# Patient Record
Sex: Female | Born: 1950 | Race: White | Hispanic: No | Marital: Married | Smoking: Never smoker
Health system: Southern US, Community
[De-identification: ages and names within clinical notes are randomized; demographics above are authoritative.]

## PROBLEM LIST (undated history)

## (undated) DIAGNOSIS — F419 Anxiety disorder, unspecified: Secondary | ICD-10-CM

## (undated) DIAGNOSIS — Z0000 Encounter for general adult medical examination without abnormal findings: Secondary | ICD-10-CM

## (undated) DIAGNOSIS — N83209 Unspecified ovarian cyst, unspecified side: Secondary | ICD-10-CM

## (undated) DIAGNOSIS — B019 Varicella without complication: Secondary | ICD-10-CM

## (undated) DIAGNOSIS — R030 Elevated blood-pressure reading, without diagnosis of hypertension: Principal | ICD-10-CM

## (undated) DIAGNOSIS — C801 Malignant (primary) neoplasm, unspecified: Secondary | ICD-10-CM

## (undated) DIAGNOSIS — H9209 Otalgia, unspecified ear: Secondary | ICD-10-CM

## (undated) DIAGNOSIS — F329 Major depressive disorder, single episode, unspecified: Secondary | ICD-10-CM

## (undated) DIAGNOSIS — R197 Diarrhea, unspecified: Secondary | ICD-10-CM

## (undated) DIAGNOSIS — I998 Other disorder of circulatory system: Secondary | ICD-10-CM

## (undated) DIAGNOSIS — E785 Hyperlipidemia, unspecified: Secondary | ICD-10-CM

## (undated) DIAGNOSIS — T7840XA Allergy, unspecified, initial encounter: Secondary | ICD-10-CM

## (undated) DIAGNOSIS — M8580 Other specified disorders of bone density and structure, unspecified site: Secondary | ICD-10-CM

## (undated) DIAGNOSIS — K219 Gastro-esophageal reflux disease without esophagitis: Secondary | ICD-10-CM

## (undated) DIAGNOSIS — L309 Dermatitis, unspecified: Secondary | ICD-10-CM

## (undated) DIAGNOSIS — R55 Syncope and collapse: Secondary | ICD-10-CM

## (undated) DIAGNOSIS — R05 Cough: Secondary | ICD-10-CM

## (undated) HISTORY — DX: Encounter for general adult medical examination without abnormal findings: Z00.00

## (undated) HISTORY — DX: Syncope and collapse: R55

## (undated) HISTORY — PX: OTHER SURGICAL HISTORY: SHX169

## (undated) HISTORY — DX: Allergy, unspecified, initial encounter: T78.40XA

## (undated) HISTORY — DX: Malignant (primary) neoplasm, unspecified: C80.1

## (undated) HISTORY — DX: Hyperlipidemia, unspecified: E78.5

## (undated) HISTORY — DX: Elevated blood-pressure reading, without diagnosis of hypertension: R03.0

## (undated) HISTORY — DX: Otalgia, unspecified ear: H92.09

## (undated) HISTORY — DX: Major depressive disorder, single episode, unspecified: F32.9

## (undated) HISTORY — DX: Unspecified ovarian cyst, unspecified side: N83.209

## (undated) HISTORY — DX: Gastro-esophageal reflux disease without esophagitis: K21.9

## (undated) HISTORY — DX: Varicella without complication: B01.9

## (undated) HISTORY — DX: Dermatitis, unspecified: L30.9

## (undated) HISTORY — DX: Cough: R05

## (undated) HISTORY — DX: Other specified disorders of bone density and structure, unspecified site: M85.80

## (undated) HISTORY — DX: Other disorder of circulatory system: I99.8

## (undated) HISTORY — DX: Anxiety disorder, unspecified: F41.9

## (undated) HISTORY — DX: Diarrhea, unspecified: R19.7

---

## 1994-08-03 HISTORY — PX: EYE SURGERY: SHX253

## 2005-03-03 HISTORY — PX: OTHER SURGICAL HISTORY: SHX169

## 2005-03-20 ENCOUNTER — Ambulatory Visit (HOSPITAL_COMMUNITY): Admission: RE | Admit: 2005-03-20 | Discharge: 2005-03-20 | Payer: Self-pay | Admitting: Orthopedic Surgery

## 2005-03-20 ENCOUNTER — Ambulatory Visit (HOSPITAL_BASED_OUTPATIENT_CLINIC_OR_DEPARTMENT_OTHER): Admission: RE | Admit: 2005-03-20 | Discharge: 2005-03-20 | Payer: Self-pay | Admitting: Orthopedic Surgery

## 2010-06-03 DIAGNOSIS — C439 Malignant melanoma of skin, unspecified: Secondary | ICD-10-CM

## 2010-06-03 HISTORY — DX: Malignant melanoma of skin, unspecified: C43.9

## 2010-08-03 HISTORY — PX: OTHER SURGICAL HISTORY: SHX169

## 2011-05-05 ENCOUNTER — Encounter: Payer: Self-pay | Admitting: Family Medicine

## 2011-05-05 ENCOUNTER — Telehealth: Payer: Self-pay | Admitting: Family Medicine

## 2011-05-05 ENCOUNTER — Ambulatory Visit (INDEPENDENT_AMBULATORY_CARE_PROVIDER_SITE_OTHER): Payer: Self-pay | Admitting: Family Medicine

## 2011-05-05 VITALS — BP 150/90 | HR 106 | Temp 98.0°F | Ht 64.25 in | Wt 174.8 lb

## 2011-05-05 DIAGNOSIS — T7840XA Allergy, unspecified, initial encounter: Secondary | ICD-10-CM

## 2011-05-05 DIAGNOSIS — M8580 Other specified disorders of bone density and structure, unspecified site: Secondary | ICD-10-CM

## 2011-05-05 DIAGNOSIS — R059 Cough, unspecified: Secondary | ICD-10-CM

## 2011-05-05 DIAGNOSIS — R05 Cough: Secondary | ICD-10-CM

## 2011-05-05 DIAGNOSIS — M79672 Pain in left foot: Secondary | ICD-10-CM

## 2011-05-05 DIAGNOSIS — Z23 Encounter for immunization: Secondary | ICD-10-CM

## 2011-05-05 DIAGNOSIS — R030 Elevated blood-pressure reading, without diagnosis of hypertension: Secondary | ICD-10-CM

## 2011-05-05 DIAGNOSIS — M2550 Pain in unspecified joint: Secondary | ICD-10-CM

## 2011-05-05 DIAGNOSIS — B019 Varicella without complication: Secondary | ICD-10-CM | POA: Insufficient documentation

## 2011-05-05 DIAGNOSIS — I10 Essential (primary) hypertension: Secondary | ICD-10-CM | POA: Insufficient documentation

## 2011-05-05 DIAGNOSIS — Z79899 Other long term (current) drug therapy: Secondary | ICD-10-CM

## 2011-05-05 DIAGNOSIS — R252 Cramp and spasm: Secondary | ICD-10-CM

## 2011-05-05 DIAGNOSIS — L309 Dermatitis, unspecified: Secondary | ICD-10-CM

## 2011-05-05 DIAGNOSIS — B269 Mumps without complication: Secondary | ICD-10-CM | POA: Insufficient documentation

## 2011-05-05 DIAGNOSIS — B059 Measles without complication: Secondary | ICD-10-CM | POA: Insufficient documentation

## 2011-05-05 DIAGNOSIS — Z0000 Encounter for general adult medical examination without abnormal findings: Secondary | ICD-10-CM

## 2011-05-05 DIAGNOSIS — IMO0001 Reserved for inherently not codable concepts without codable children: Secondary | ICD-10-CM

## 2011-05-05 DIAGNOSIS — H9209 Otalgia, unspecified ear: Secondary | ICD-10-CM

## 2011-05-05 DIAGNOSIS — M94262 Chondromalacia, left knee: Secondary | ICD-10-CM

## 2011-05-05 DIAGNOSIS — C439 Malignant melanoma of skin, unspecified: Secondary | ICD-10-CM

## 2011-05-05 HISTORY — DX: Allergy, unspecified, initial encounter: T78.40XA

## 2011-05-05 HISTORY — DX: Dermatitis, unspecified: L30.9

## 2011-05-05 HISTORY — DX: Otalgia, unspecified ear: H92.09

## 2011-05-05 HISTORY — DX: Reserved for inherently not codable concepts without codable children: IMO0001

## 2011-05-05 HISTORY — DX: Other specified disorders of bone density and structure, unspecified site: M85.80

## 2011-05-05 HISTORY — DX: Cough, unspecified: R05.9

## 2011-05-05 LAB — HEPATIC FUNCTION PANEL
AST: 30 U/L (ref 0–37)
Alkaline Phosphatase: 86 U/L (ref 39–117)
Bilirubin, Direct: 0 mg/dL (ref 0.0–0.3)
Total Bilirubin: 0.7 mg/dL (ref 0.3–1.2)

## 2011-05-05 LAB — MAGNESIUM: Magnesium: 2.2 mg/dL (ref 1.5–2.5)

## 2011-05-05 LAB — CBC
MCV: 93.4 fL (ref 78.0–100.0)
Platelets: 272 10*3/uL (ref 150–400)
RBC: 4.57 MIL/uL (ref 3.87–5.11)
WBC: 5.6 10*3/uL (ref 4.0–10.5)

## 2011-05-05 LAB — RENAL FUNCTION PANEL
CO2: 27 mEq/L (ref 19–32)
Chloride: 105 mEq/L (ref 96–112)
GFR: 111.15 mL/min (ref >60.00)
Potassium: 3.9 mEq/L (ref 3.5–5.1)
Sodium: 141 mEq/L (ref 135–145)

## 2011-05-05 LAB — URIC ACID: Uric Acid, Serum: 4 mg/dL (ref 2.4–7.0)

## 2011-05-05 LAB — LIPID PANEL
HDL: 80.5 mg/dL (ref >39.00)
VLDL: 15.2 mg/dL (ref 0.0–40.0)

## 2011-05-05 MED ORDER — AZITHROMYCIN 250 MG PO TABS: ORAL_TABLET | ORAL | Status: DC

## 2011-05-05 MED ORDER — TETANUS-DIPHTH-ACELL PERTUSSIS 5-2.5-18.5 LF-MCG/0.5 IM SUSP: 0.5000 mL | Freq: Once | INTRAMUSCULAR | Status: DC

## 2011-05-05 NOTE — Telephone Encounter (Signed)
Per md okay to send azithromycin. Patient informed that RX was sent to pharmacy

## 2011-05-05 NOTE — Telephone Encounter (Signed)
Patient in lobby waiting for zpack

## 2011-05-05 NOTE — Patient Instructions (Addendum)
Preventative Care for Adults - Female Studies show that half of deaths in the [place] today result from unhealthy lifestyle practices. This includes ignoring preventive care suggestions. Preventive health guidelines for women include the following key practices:  A routine yearly physical is a good way to check with your primary caregiver about your health and preventive screening. It is a chance to share any concerns and updates on your health, and to receive a thorough all-over exam.   If you smoke cigarettes, find out from your caregiver how to quit. It can literally save your life, no matter how long you have been a tobacco user. If you do not use tobacco, never start.   Maintain a healthy diet and normal weight. Increased weight leads to problems with blood pressure and diabetes. Decrease saturated fat in your diet and increase regular exercise. Eat a variety of foods, including fruit, vegetables, animal or vegetable protein (meat, fish, chicken, and eggs, or beans, lentils, and tofu), and grains, such as rice. Get information about proper diet from your caregiver, if needed.   Aerobic exercise helps maintain good heart health. The CDC and the American College of Sports Medicine recommend 30 minutes of moderate-intensity exercise (a brisk walk that increases your heart rate and breathing) on most days of the week. Ongoing high blood pressure should be treated with medicines, if weight loss and exercise are not effective.   Avoid smoking, drinking too much alcohol (more than two drinks per day), and use of street drugs. Do not share needles with anyone. Ask for professional help if you need support or instructions about stopping the use of alcohol, cigarettes, or drugs.   Maintain normal blood lipids and cholesterol, by minimizing your intake of saturated fat. Eat a well rounded diet, with plenty of fruit and vegetables. The National Institutes of Health encourage women to eat 5-9 servings of  fruit and vegetables each day. Your caregiver can give instructions to help you keep your risk of heart disease or stroke low. High blood pressure causes heart disease and increases risk of stroke. Blood pressure should be checked every 1-2 years, from age 20 onward.   Blood tests for high cholesterol, which causes heart and vessel disease, should begin at age 20 and be repeated every 5 years, if test results are normal. (Repeat tests more often if results are high.)   Diabetes screening involves taking a blood sample to check your blood sugar level, after a fasting period. This is done once every 3 years, after age 45, if test results are normal.   Breast cancer screening is essential to preventive care for women. All women age 20 and older should perform a breast self-exam every month. At age 40 and older, women should have their caregiver complete a breast exam each year. Women at ages 40-50 should have a mammogram (x-ray film) of the breasts each year. Your caregiver can discuss when to start your yearly mammograms.   Cervical cancer screening includes taking a Pap smear (sample of cells examined under a microscope) from the cervix (end of the uterus). It also includes testing for HPV (Human Papilloma Virus, which can cause cervical cancer). Screening and a pelvic exam should begin at age 21, or 3 years after a woman becomes sexually active. Screening should occur every year, with a Pap smear but no HPV testing, up to age 30. After age 30, you should have a Pap smear every 3 years with HPV testing, if no HPV was found previously.     Colon cancer can be detected, and often prevented, long before it is life threatening. Most routine colon cancer screening begins at the age of 50. On a yearly basis, your caregiver may provide easy-to-use take-home tests to check for hidden blood in the stool. Use of a small camera at the end of a tube, to directly examine the colon (sigmoidoscopy or colonoscopy), can  detect the earliest forms of colon cancer and can be life saving. Talk to your caregiver about this at age 50, when routine screening begins. (Screening is repeated every 5 years, unless early forms of pre-cancerous polyps or small growths are found.)   Practice safe sex. Use condoms. Condoms are used for birth control and to reduce the spread of sexually transmitted infections (STIs). Unsafe sex is sexual activity without the use of safeguards, such as condoms and avoidance of high-risk acts, to reduce the chances of getting or spreading STIs. STIs include gonorrhea (the clap), chlamydia, syphilis, trichimonas, herpes, HPV (human papilloma virus) and HIV (human immunodeficiency virus), which causes AIDS. Herpes, HIV, and HPV are viral illnesses that have no cure. They can result in disability, cancer, and death.   HPV causes cancer of the cervix, and other infections that can be transmitted from person to person. There is a vaccine for HPV, and females should get immunized between the ages of 11 and 26. It requires a series of 3 shots.   Osteoporosis is a disease in which the bones lose minerals and strength as we age. This can result in serious bone fractures. Risk of osteoporosis can be identified using a bone density scan. Women ages 65 and over should discuss this with their caregivers, as should women after menopause who have other risk factors. Ask your caregiver whether you should be taking a calcium supplement and Vitamin D, to reduce the rate of osteoporosis.   Menopause can be associated with physical symptoms and risks. Hormone replacement therapy is available to decrease these. You should talk to your caregiver about whether starting or continuing to take hormones is right for you.   Use sunscreen with SPF (skin protection factor) of 15 or more. Apply sunscreen liberally and repeatedly throughout the day. Being outside in the sun, when your shadow is shorter than you are, means you are being  exposed to sun at greater intensity. Lighter skinned people are at a greater risk of skin cancer. Wear sunglasses, to protect your eyes from too much damaging sunlight (which can speed up cataract formation).   Once a month, do a whole body skin exam or review, using a mirror to look at your back. Notify your caregiver of changes in moles, especially if there are changes in shapes, colors, irregular border, a size larger than a pencil eraser, or new moles develop.   Keep carbon monoxide and smoke detectors in your home, and functioning, at all times. Change the batteries every 6 months, or use a model that plugs into the wall.   Stay up to date with your tetanus shots and other required immunizations. A booster for tetanus should be given every 10 years. Be sure to get your flu shot every year, since 5%-20% of the [place] population comes down with the flu. The composition of the flu vaccine changes each year, so being vaccinated once is not enough. Get your shot in the fall, before the flu season peaks. The table below lists important vaccines to get. Other vaccines to consider include for Hepatitis A virus (to prevent a form of   infection of the liver, by a virus acquired from food), Varicella Zoster (a virus that causes shingles), and Meninogoccal (against bacteria which cause a form of meningitis).   Brush your teeth twice a day with fluoride toothpaste, and floss once a day. Good oral hygiene prevents tooth decay and gum disease, which can be painful and can cause other health problems. Visit your dentist for a routine oral and dental check up and preventive care every 6-12 months.   The Body Mass Index or BMI is a way of measuring how much of your body is fat. Having a BMI above 27 increases the risk of heart disease, diabetes, hypertension, stroke and other problems related to obesity. Your caregiver can help determine your BMI, and can develop an exercise and dietary program to help you achieve or  maintain this measurement at a healthy level.   Wear seat belts whenever you are in a vehicle, whether as passenger or driver, and even for short drives of a few minutes.   If you bicycle, wear a helmet at all times.  Preventative Care for Adult Women  Preventative Services Ages 51-39 Ages 54-64 Ages 62 and over  Health risk assessment and lifestyle counseling.     Blood pressure check.** Every 1-2 years Every 1-2 years Every 1-2 years  Total cholesterol check including HDL.** Every 5 years beginning at age 26 Every 5 years beginning at age 20, or more often if risk is high Every 5 years through age 1, then optional  Breast self exam. Monthly in all women ages 30 and older Monthly Monthly  Clinical breast exam.** Every 3 years beginning at age 22 Every year Every year  Mammogram.**  Every year beginning at age 58, optional from age 74-49 (discuss with your caregiver). Every year until age 45, then optional  Pap Smear** and HPV Screening. Every year from ages 76 through 59 Every 3 years from ages 59 through 53, if HPV is negative Optional; talk with your caregiver  Flexible sigmoidoscopy** or colonoscopy.**   Every 5 years beginning at age 38 Every 5 years until age 31; then optional  FOBT (fecal occult blood test) of stool.  Every year beginning at age 65 Every year until 55; then optional  Skin self-exam. Monthly Monthly Monthly  Tetanus-diphtheria (Td) immunization. Every 10 years Every 10 years Every 10 years  Influenza immunization.** Every year Every year Every year  HPV immunization. Once between the ages of 15 and 92     Pneumococcal immunization.** Optional Optional Every 5 years  Hepatitis B immunization.** Series of 3 immunizations  (if not done previously, usually given at 0, 1 to 2, and 4 to 6 months)  Check with your caregiver, if vaccination not previously given Check with your caregiver, if vaccination not previously given  ** Family history and personal history of risk and  conditions may change your caregiver's recommendations.  Document Released: 09/15/2001 Document Re-Released: 10/14/2009 [facility] Patient Information 2011 [place], [place].   Minimize hot liquids and caffeine, get 8 hours of sleep nightly, do not skip meals, eat small frequent meals with lean proteins and complex carbs, Avoid heavy carb loads, add a fatty acid supplement such as a fish oil cap like MegaRed 1 cap daily, will help the knee also

## 2011-05-11 ENCOUNTER — Telehealth: Payer: Self-pay | Admitting: Family Medicine

## 2011-05-11 ENCOUNTER — Encounter: Payer: Self-pay | Admitting: Family Medicine

## 2011-05-11 NOTE — Telephone Encounter (Signed)
Order is in the system, glad she agrees to proceed

## 2011-05-12 ENCOUNTER — Encounter: Payer: Self-pay | Admitting: Gastroenterology

## 2011-05-13 ENCOUNTER — Encounter: Payer: Self-pay | Admitting: Family Medicine

## 2011-05-13 DIAGNOSIS — C439 Malignant melanoma of skin, unspecified: Secondary | ICD-10-CM | POA: Insufficient documentation

## 2011-05-13 DIAGNOSIS — G43909 Migraine, unspecified, not intractable, without status migrainosus: Secondary | ICD-10-CM | POA: Insufficient documentation

## 2011-05-13 DIAGNOSIS — M94262 Chondromalacia, left knee: Secondary | ICD-10-CM | POA: Insufficient documentation

## 2011-05-13 DIAGNOSIS — Z0000 Encounter for general adult medical examination without abnormal findings: Secondary | ICD-10-CM | POA: Insufficient documentation

## 2011-05-13 NOTE — Assessment & Plan Note (Signed)
Wears seat belts, declines flu shot today. Recommend DASH diet

## 2011-05-13 NOTE — Assessment & Plan Note (Signed)
Patient following with dermatology has an appt later this month. Is noting some intermittent erthema, warmth and lesions on face recently none now. Consider Rosacea vs Acne. Use Cetaphil Soap and Witch Hazel Astringent and discuss with dermatology

## 2011-05-13 NOTE — Assessment & Plan Note (Signed)
Increase activity and maintain Calcium/Vitamin D supplement. Does Bone scans intermittently at [facility]

## 2011-05-13 NOTE — Progress Notes (Signed)
Grace Klein [id number] 1951-06-27 05/13/2011      Progress Note New Patient  Subjective  Chief Complaint  Chief Complaint  Patient presents with  . Establish Care    new patient    HPI  This is a 60 year old Caucasian female who is in today for new patient appointment. Generally in good health but does have a few concerns today. One is a mild irritative cough for the last 5 weeks. Occasionally productive of clear sputum. She does believe this is allergy related. No fevers, chills, headache, chest pain, palpitations, shortness of breath, GI or GU complaints. She does have a history of melanoma status post Mohs procedures. She does follow with dermatology. She is complaining of some episodes of red hot bumps on her face they come and go. They're not present now. She follows with ophthalmology and takes Ocuvite for her eyes. She is previously followed with orthopedics or podiatry for some knee and foot pain. Denies a history of documented hypertension.  Past Medical History  Diagnosis Date  . Chicken pox as a child  . Measles as a child  . Mumps as a child  . Elevated BP 05/05/2011  . Allergic state 05/05/2011  . Cough 05/05/2011  . Ear pain 05/05/2011  . Dermatitis 05/05/2011  . Allergy     seasonal  . 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 Osteopenia 05/05/2011  . Migraine 05/13/2011  . Preventative health care 05/13/2011    Past Surgical History  Procedure Date  . Facial sugeries 2012    X 2  . 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 Eye surgery 1996    right, near sighted correction    Family History  Problem Relation Age of Onset  . Macular degeneration Mother   . Hypertension Mother   . Arthritis Mother   . Hyperlipidemia Mother   . Hypertension Father   . Heart disease Father   . Other Father     bypass surgery   . Hyperlipidemia Father   . Depression Sister     on Paxil  . Alcohol abuse Sister   . Diabetes Maternal Grandmother     History    Social History  . Marital Status: Married    Spouse Name: N/A    Number of Children: N/A  . Years of Education: N/A   Occupational History  . Not on file.   Social History Main Topics  . Smoking status: Never Smoker   . Smokeless tobacco: Not on file  . Alcohol Use: Yes     a couple ounces once or twice a week  . Drug Use: No  . Sexually Active: No   Other Topics Concern  . Not on file   Social History Narrative  . No narrative on file    No current outpatient prescriptions on file prior to visit.   No current facility-administered medications on file prior to visit.    Allergies  Allergen Reactions  . Clindamycin/Lincomycin Diarrhea    Review of Systems  Review of Systems  Constitutional: Negative for fever, chills and malaise/fatigue.  HENT: Positive for ear pain. Negative for hearing loss, nosebleeds, congestion, sore throat and ear discharge.   Eyes: Negative for discharge.  Respiratory: Positive for cough. Negative for sputum production, shortness of breath and wheezing.        Cough x 5 weeks occasionally productive of clear phlegm, she believes it is allergic  Cardiovascular: Negative for chest pain, palpitations and  leg swelling.  Gastrointestinal: Negative for heartburn, nausea, vomiting, abdominal pain, diarrhea, constipation and blood in stool.  Genitourinary: Negative for dysuria, urgency, frequency and hematuria.  Musculoskeletal: Positive for joint pain. Negative for myalgias, back pain and falls.       Left foot pain, notes history of stress fracture and previous ongoing care with podiatry  Skin: Negative for rash.  Neurological: Negative for dizziness, tremors, sensory change, focal weakness, loss of consciousness, weakness and headaches.  Endo/Heme/Allergies: Negative for polydipsia. Does not bruise/bleed easily.  Psychiatric/Behavioral: Negative for depression and suicidal ideas. The patient is not nervous/anxious and does not have insomnia.      Objective  BP 150/90  Pulse 106  Temp(Src) 98 F (36.7 C) (Oral)  Ht 5' 4.25" (1.632 m)  Wt 174 lb 12.8 oz (79.289 kg)  BMI 29.77 kg/m2  SpO2 100%  Physical Exam  Physical Exam  Constitutional: She is oriented to person, place, and time and well-developed, well-nourished, and in no distress. No distress.  HENT:  Head: Normocephalic and atraumatic.  Right Ear: External ear normal.  Left Ear: External ear normal.  Nose: Nose normal.  Mouth/Throat: Oropharynx is clear and moist. No oropharyngeal exudate.  Eyes: Conjunctivae are normal. Pupils are equal, round, and reactive to light. Right eye exhibits no discharge. Left eye exhibits no discharge. No scleral icterus.  Neck: Normal range of motion. Neck supple. No thyromegaly present.  Cardiovascular: Normal rate, regular rhythm, normal heart sounds and intact distal pulses.   No murmur heard. Pulmonary/Chest: Effort normal and breath sounds normal. No respiratory distress. She has no wheezes. She has no rales.  Abdominal: Soft. Bowel sounds are normal. She exhibits no distension and no mass. There is no tenderness.  Musculoskeletal: Normal range of motion. She exhibits no edema and no tenderness.  Lymphadenopathy:    She has no cervical adenopathy.  Neurological: She is alert and oriented to person, place, and time. She has normal reflexes. No cranial nerve deficit. Coordination normal.  Skin: Skin is warm and dry. No rash noted. She is not diaphoretic.  Psychiatric: Mood, memory and affect normal.       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 Start Fish oil caps  Melanoma Patient following with dermatology has an appt later this month. Is noting some intermittent erthema, warmth and lesions on face recently none now. Consider Rosacea vs Acne. Use Cetaphil Soap and Witch Hazel Astringent and discuss with dermatology  Elevated BP Improved with  repeat but still elevated, encouraged increased activity, lower sodium, consider DASH diet, recheck bp at next visit.  Osteopenia Increase activity and maintain Calcium/Vitamin D supplement. [facility]  Preventative health care Wears seat belts, declines flu shot today. Recommend DASH diet

## 2011-05-13 NOTE — Assessment & Plan Note (Signed)
Has followed with ortho inpast, encouraged ongoing physical activity. Start Fish oil caps

## 2011-05-13 NOTE — Assessment & Plan Note (Signed)
Improved with repeat but still elevated, encouraged increased activity, lower sodium, consider DASH diet, recheck bp at next visit.

## 2011-05-14 ENCOUNTER — Encounter: Payer: Self-pay | Admitting: Family Medicine

## 2011-05-25 ENCOUNTER — Ambulatory Visit (AMBULATORY_SURGERY_CENTER): Payer: Private Health Insurance - Indemnity | Admitting: *Deleted

## 2011-05-25 VITALS — Ht 64.5 in | Wt 175.0 lb

## 2011-05-25 DIAGNOSIS — Z1211 Encounter for screening for malignant neoplasm of colon: Secondary | ICD-10-CM

## 2011-05-25 MED ORDER — PEG-KCL-NACL-NASULF-NA ASC-C 100 G PO SOLR: 1.0000 | Freq: Once | ORAL | Status: DC

## 2011-06-05 ENCOUNTER — Ambulatory Visit (INDEPENDENT_AMBULATORY_CARE_PROVIDER_SITE_OTHER): Payer: Private Health Insurance - Indemnity | Admitting: Family Medicine

## 2011-06-05 ENCOUNTER — Encounter: Payer: Self-pay | Admitting: Family Medicine

## 2011-06-05 VITALS — BP 132/84 | HR 87 | Temp 97.9°F | Ht 64.25 in | Wt 171.8 lb

## 2011-06-05 DIAGNOSIS — E785 Hyperlipidemia, unspecified: Secondary | ICD-10-CM

## 2011-06-05 DIAGNOSIS — R030 Elevated blood-pressure reading, without diagnosis of hypertension: Secondary | ICD-10-CM

## 2011-06-05 DIAGNOSIS — M899 Disorder of bone, unspecified: Secondary | ICD-10-CM

## 2011-06-05 DIAGNOSIS — T7840XA Allergy, unspecified, initial encounter: Secondary | ICD-10-CM

## 2011-06-05 DIAGNOSIS — IMO0001 Reserved for inherently not codable concepts without codable children: Secondary | ICD-10-CM

## 2011-06-05 DIAGNOSIS — L309 Dermatitis, unspecified: Secondary | ICD-10-CM

## 2011-06-05 DIAGNOSIS — M8580 Other specified disorders of bone density and structure, unspecified site: Secondary | ICD-10-CM

## 2011-06-05 DIAGNOSIS — M949 Disorder of cartilage, unspecified: Secondary | ICD-10-CM

## 2011-06-05 DIAGNOSIS — Z0000 Encounter for general adult medical examination without abnormal findings: Secondary | ICD-10-CM

## 2011-06-05 DIAGNOSIS — L259 Unspecified contact dermatitis, unspecified cause: Secondary | ICD-10-CM

## 2011-06-05 HISTORY — DX: Hyperlipidemia, unspecified: E78.5

## 2011-06-05 NOTE — Assessment & Plan Note (Signed)
Avoid trans fats, minimize saturated fats and simple carbs and add a Megared cap and a fiber supplement daily

## 2011-06-05 NOTE — Assessment & Plan Note (Signed)
No c/o today 

## 2011-06-05 NOTE — Assessment & Plan Note (Signed)
Pap not due til December, recommend next pap at next visit

## 2011-06-05 NOTE — Assessment & Plan Note (Signed)
Good numbers today, encouraged to continue with DASH diet

## 2011-06-05 NOTE — Assessment & Plan Note (Addendum)
Mild rash on forearms after using a generic sunscreen, mildly pruritic, may use Witch Hazel astringent or Sarna lotion prn, Zyrtec and/or Benadryl prn

## 2011-06-05 NOTE — Patient Instructions (Signed)
Elevated BP As your heart beats, it forces blood through your arteries. This force is your blood pressure. If the pressure is too high, it is called hypertension (HTN) or high blood pressure. HTN is dangerous because you may have it and not know it. High blood pressure may mean that your heart has to work harder to pump blood. Your arteries may be narrow or stiff. The extra work puts you at risk for heart disease, stroke, and other problems.  Blood pressure consists of two numbers, a higher number over a lower, 110/72, for example. It is stated as "110 over 72." The ideal is below 120 for the top number (systolic) and under 80 for the bottom (diastolic). Write down your blood pressure today. You should pay close attention to your blood pressure if you have certain conditions such as:  Heart failure.   Prior heart attack.   Diabetes   Chronic kidney disease.   Prior stroke.   Multiple risk factors for heart disease.  To see if you have HTN, your blood pressure should be measured while you are seated with your arm held at the level of the heart. It should be measured at least twice. A one-time elevated blood pressure reading (especially in the Emergency Department) does not mean that you need treatment. There may be conditions in which the blood pressure is different between your right and left arms. It is important to see your caregiver soon for a recheck. Most people have essential hypertension which means that there is not a specific cause. This type of high blood pressure may be lowered by changing lifestyle factors such as:  Stress.   Smoking.   Lack of exercise.   Excessive weight.   Drug/tobacco/alcohol use.   Eating less salt.  Most people do not have symptoms from high blood pressure until it has caused damage to the body. Effective treatment can often prevent, delay or reduce that damage. TREATMENT  When a cause has been identified, treatment for high blood pressure is  directed at the cause. There are a large number of medications to treat HTN. These fall into several categories, and your caregiver will help you select the medicines that are best for you. Medications may have side effects. You should review side effects with your caregiver. If your blood pressure stays high after you have made lifestyle changes or started on medicines,   Your medication(s) may need to be changed.   Other problems may need to be addressed.   Be certain you understand your prescriptions, and know how and when to take your medicine.   Be sure to follow up with your caregiver within the time frame advised (usually within two weeks) to have your blood pressure rechecked and to review your medications.   If you are taking more than one medicine to lower your blood pressure, make sure you know how and at what times they should be taken. Taking two medicines at the same time can result in blood pressure that is too low.  SEEK IMMEDIATE MEDICAL CARE IF:  You develop a severe headache, blurred or changing vision, or confusion.   You have unusual weakness or numbness, or a faint feeling.   You have severe chest or abdominal pain, vomiting, or breathing problems.  MAKE SURE YOU:   Understand these instructions.   Will watch your condition.   Will get help right away if you are not doing well or get worse.  Document Released: 07/20/2005 Document Revised: 04/01/2011 Document  Reviewed: 03/09/2008 [facility] Patient Information 2012 [place], [place].

## 2011-06-05 NOTE — Assessment & Plan Note (Signed)
Patient repeat Bone Densitometry ordered today, continue Calcium, Vitamin D supplements and encouraged some small hand held weight arm curls and walking to maintain bone density

## 2011-06-05 NOTE — Progress Notes (Signed)
Grace Klein [id number] 1951-07-19 06/05/2011      Progress Note-Follow Up  Subjective  Chief Complaint  Chief Complaint  Patient presents with  . Follow-up    1 month follow up    HPI  Patient is a 60 year old Caucasian female in today for followup on a new patient appointment overall she is feeling well. She's recently had dermabrasion to her nose for her melanoma. Tolerating the recovery well although it is itchy. She has her colonoscopy scheduled for next week on the seventh with Dr. Jarold Motto. She offers no acute complaints. She's had no febrile illness, chest pain, palpitations, shortness of breath, GI or GU complaints since her last visit. She reports her last Pap was in December of 2011 was normal. She is requesting a referral for her bone densitometry at [facility]. She is noting papular, pruritic rash on her arms she says has been present since she used a new sunscreen. It is slowly improving.  Past Medical History  Diagnosis Date  . Chicken pox as a child  . Measles as a child  . Mumps as a child  . Elevated BP 05/05/2011  . Allergic state 05/05/2011  . Cough 05/05/2011  . Ear pain 05/05/2011  . Dermatitis 05/05/2011  . Allergy     seasonal  . 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 Osteopenia 05/05/2011  . Migraine 05/13/2011  . Preventative health care 05/13/2011  . Hyperlipidemia 06/05/2011    Past Surgical History  Procedure Date  . Facial sugeries 2012    X 2  . 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 Eye surgery 1996    right, near sighted correction    Family History  Problem Relation Age of Onset  . Macular degeneration Mother   . Hypertension Mother   . Arthritis Mother   . Hyperlipidemia Mother   . Hypertension Father   . Heart disease Father   . Other Father     bypass surgery   . Hyperlipidemia Father   . Depression Sister     on Paxil  . Alcohol abuse Sister   . Diabetes Maternal Grandmother   . Colon cancer Neg Hx   . Esophageal  cancer Neg Hx   . Stomach cancer Neg Hx     History   Social History  . Marital Status: Married    Spouse Name: N/A    Number of Children: N/A  . Years of Education: N/A   Occupational History  . Not on file.   Social History Main Topics  . Smoking status: Never Smoker   . Smokeless tobacco: Not on file  . Alcohol Use: No     a couple ounces once or twice a week  . Drug Use: No  . Sexually Active: No   Other Topics Concern  . Not on file   Social History Narrative  . No narrative on file    Current Outpatient Prescriptions on File Prior to Visit  Medication Sig Dispense Refill  . aspirin 81 MG tablet Take 81 mg by mouth daily.        . calcium carbonate (OS-CAL) 600 MG TABS Take 600 mg by mouth 2 (two) times daily with a meal.        . Cetirizine HCl (ZYRTEC PO) Take 1 tablet by mouth daily.        . DiphenhydrAMINE HCl (BENADRYL PO) Take 1 tablet by mouth as needed.        Marland Kitchen  Magnesium 250 MG TABS Take 1 tablet by mouth daily.        . multivitamin-lutein (OCUVITE-LUTEIN) CAPS Take 1 capsule by mouth daily.        . OMEGA-3 KRILL OIL PO Take 90 mg by mouth daily.        . peg 3350 powder (MOVIPREP) 100 G SOLR Take 1 kit (100 g total) by mouth once.  1 kit  0  . vitamin B-12 (CYANOCOBALAMIN) 500 MCG tablet Take 500 mcg by mouth daily.          Allergies  Allergen Reactions  . Clindamycin/Lincomycin Diarrhea    Review of Systems  Review of Systems  Constitutional: Negative for fever and malaise/fatigue.  HENT: Negative for congestion.   Eyes: Negative for discharge.  Respiratory: Negative for shortness of breath.   Cardiovascular: Negative for chest pain, palpitations and leg swelling.  Gastrointestinal: Negative for nausea, abdominal pain and diarrhea.  Genitourinary: Negative for dysuria.  Musculoskeletal: Negative for falls.  Skin: Positive for itching and rash.       Forearms, improving papular rash developed after application of new generic sun  screen  Has a bandage over her nose secondary to recent Dermabrasion for melanoma, following with Dermatology  Neurological: Negative for loss of consciousness and headaches.  Endo/Heme/Allergies: Negative for polydipsia.  Psychiatric/Behavioral: Negative for depression and suicidal ideas. The patient is not nervous/anxious and does not have insomnia.     Objective  BP 132/84  Pulse 87  Temp(Src) 97.9 F (36.6 C) (Oral)  Ht 5' 4.25" (1.632 m)  Wt 171 lb 12.8 oz (77.928 kg)  BMI 29.26 kg/m2  SpO2 100%  Physical Exam  Physical Exam  Constitutional: She is oriented to person, place, and time and well-developed, well-nourished, and in no distress. No distress.  HENT:  Head: Normocephalic and atraumatic.  Eyes: Conjunctivae are normal.  Neck: Neck supple. No thyromegaly present.  Cardiovascular: Normal rate, regular rhythm and normal heart sounds.   No murmur heard. Pulmonary/Chest: Effort normal and breath sounds normal. She has no wheezes.  Abdominal: She exhibits no distension and no mass.  Musculoskeletal: She exhibits no edema.  Lymphadenopathy:    She has no cervical adenopathy.  Neurological: She is alert and oriented to person, place, and time.  Skin: Skin is warm and dry. Rash noted. She is not diaphoretic.       Papular lesions small and erythematous, on forearms. Bandage over nose.  Psychiatric: Memory, affect and judgment normal.    Lab Results  Component Value Date   TSH 0.37 05/05/2011   Lab Results  Component Value Date   WBC 5.6 05/05/2011   HGB 13.8 05/05/2011   HCT 42.7 05/05/2011   MCV 93.4 05/05/2011   PLT 272 05/05/2011   Lab Results  Component Value Date   CREATININE 0.6 05/05/2011   BUN 13 05/05/2011   NA 141 05/05/2011   K 3.9 05/05/2011   CL 105 05/05/2011   CO2 27 05/05/2011   Lab Results  Component Value Date   ALT 22 05/05/2011   AST 30 05/05/2011   ALKPHOS 86 05/05/2011   BILITOT 0.7 05/05/2011   Lab Results  Component Value Date   CHOL  212* 05/05/2011   Lab Results  Component Value Date   HDL 80.50 05/05/2011   No results found for this basename: [facility]   Lab Results  Component Value Date   TRIG 76.0 05/05/2011   Lab Results  Component Value Date   CHOLHDL 3  05/05/2011     Assessment & Plan  Dermatitis Mild rash on forearms after using a generic sunscreen, mildly pruritic, may use Witch Hazel astringent or Sarna lotion prn, Zyrtec and/or Benadryl prn  Osteopenia Patient repeat Bone Densitometry ordered today, continue Calcium, Vitamin D supplements and encouraged some small hand held weight arm curls and walking to maintain bone density  Elevated BP Good numbers today, encouraged to continue with DASH diet  Allergic state No c/o today  Preventative health care Pap not due til December, recommend next pap at next visit  Hyperlipidemia Avoid trans fats, minimize saturated fats and simple carbs and add a Megared cap and a fiber supplement daily

## 2011-06-10 ENCOUNTER — Ambulatory Visit (AMBULATORY_SURGERY_CENTER): Payer: Private Health Insurance - Indemnity | Admitting: Gastroenterology

## 2011-06-10 ENCOUNTER — Encounter: Payer: Self-pay | Admitting: Gastroenterology

## 2011-06-10 VITALS — BP 152/95 | HR 98 | Temp 97.4°F | Resp 17 | Ht 65.3 in | Wt 175.0 lb

## 2011-06-10 DIAGNOSIS — Z1211 Encounter for screening for malignant neoplasm of colon: Secondary | ICD-10-CM

## 2011-06-10 MED ORDER — SODIUM CHLORIDE 0.9 % IV SOLN: 500.0000 mL | INTRAVENOUS | Status: DC

## 2011-06-10 NOTE — Progress Notes (Signed)
Pressure applied to abdomen to cecum.

## 2011-06-10 NOTE — Patient Instructions (Signed)
Please review discharge instructions (blue and green sheets)  Resume regular medications  Please review information about diverticulosis and high fiber diets  Recall colonoscopy in 10 years

## 2011-06-11 ENCOUNTER — Telehealth: Payer: Self-pay | Admitting: *Deleted

## 2011-06-11 NOTE — Telephone Encounter (Signed)

## 2011-06-15 ENCOUNTER — Other Ambulatory Visit: Payer: Private Health Insurance - Indemnity | Admitting: Gastroenterology

## 2011-07-27 ENCOUNTER — Encounter: Payer: Self-pay | Admitting: Family Medicine

## 2011-12-03 ENCOUNTER — Other Ambulatory Visit (INDEPENDENT_AMBULATORY_CARE_PROVIDER_SITE_OTHER): Payer: Private Health Insurance - Indemnity

## 2011-12-03 DIAGNOSIS — E785 Hyperlipidemia, unspecified: Secondary | ICD-10-CM

## 2011-12-03 DIAGNOSIS — M8580 Other specified disorders of bone density and structure, unspecified site: Secondary | ICD-10-CM

## 2011-12-03 DIAGNOSIS — R030 Elevated blood-pressure reading, without diagnosis of hypertension: Secondary | ICD-10-CM

## 2011-12-03 LAB — HEPATIC FUNCTION PANEL
ALT: 16 U/L (ref 0–35)
AST: 25 U/L (ref 0–37)
Alkaline Phosphatase: 81 U/L (ref 39–117)
Bilirubin, Direct: 0 mg/dL (ref 0.0–0.3)
Total Bilirubin: 0.7 mg/dL (ref 0.3–1.2)
Total Protein: 7.7 g/dL (ref 6.0–8.3)

## 2011-12-03 LAB — RENAL FUNCTION PANEL
Albumin: 4.5 g/dL (ref 3.5–5.2)
Calcium: 9.7 mg/dL (ref 8.4–10.5)
Creatinine, Ser: 0.7 mg/dL (ref 0.4–1.2)
Glucose, Bld: 74 mg/dL (ref 70–99)
Phosphorus: 3 mg/dL (ref 2.3–4.6)
Potassium: 3.8 mEq/L (ref 3.5–5.1)

## 2011-12-03 LAB — CBC
HCT: 41.5 % (ref 36.0–46.0)
Hemoglobin: 13.9 g/dL (ref 12.0–15.0)
MCHC: 33.3 g/dL (ref 30.0–36.0)
MCV: 94.5 fl (ref 78.0–100.0)
RDW: 15.1 % — ABNORMAL HIGH (ref 11.5–14.6)
WBC: 5.6 10*3/uL (ref 4.5–10.5)

## 2011-12-03 LAB — LIPID PANEL: Total CHOL/HDL Ratio: 3

## 2011-12-06 LAB — VITAMIN D 1,25 DIHYDROXY: Vitamin D 1, 25 (OH)2 Total: 69 pg/mL (ref 18–72)

## 2011-12-08 ENCOUNTER — Ambulatory Visit (INDEPENDENT_AMBULATORY_CARE_PROVIDER_SITE_OTHER): Payer: Private Health Insurance - Indemnity | Admitting: Family Medicine

## 2011-12-08 ENCOUNTER — Encounter: Payer: Self-pay | Admitting: Family Medicine

## 2011-12-08 VITALS — BP 134/78 | HR 98 | Temp 100.0°F | Ht 64.25 in | Wt 164.8 lb

## 2011-12-08 DIAGNOSIS — G43909 Migraine, unspecified, not intractable, without status migrainosus: Secondary | ICD-10-CM

## 2011-12-08 DIAGNOSIS — Z0000 Encounter for general adult medical examination without abnormal findings: Secondary | ICD-10-CM

## 2011-12-08 DIAGNOSIS — K219 Gastro-esophageal reflux disease without esophagitis: Secondary | ICD-10-CM

## 2011-12-08 DIAGNOSIS — L905 Scar conditions and fibrosis of skin: Secondary | ICD-10-CM | POA: Insufficient documentation

## 2011-12-08 DIAGNOSIS — N83209 Unspecified ovarian cyst, unspecified side: Secondary | ICD-10-CM

## 2011-12-08 DIAGNOSIS — T7840XA Allergy, unspecified, initial encounter: Secondary | ICD-10-CM

## 2011-12-08 DIAGNOSIS — E785 Hyperlipidemia, unspecified: Secondary | ICD-10-CM

## 2011-12-08 DIAGNOSIS — IMO0001 Reserved for inherently not codable concepts without codable children: Secondary | ICD-10-CM

## 2011-12-08 HISTORY — DX: Unspecified ovarian cyst, unspecified side: N83.209

## 2011-12-08 HISTORY — DX: Reserved for inherently not codable concepts without codable children: IMO0001

## 2011-12-08 NOTE — Assessment & Plan Note (Signed)
Using scar away patch and is shrinking. Did have a steroid injection as well.

## 2011-12-08 NOTE — Assessment & Plan Note (Signed)
Is considering complete hysterectomy because they do not have a tissue diagnosis of the lesion. Will reconsider after July [place]

## 2011-12-08 NOTE — Progress Notes (Signed)
Patient ID: DEBROAH SHUTTLEWORTH, female   DOB: [birth year], 61 y.o.   MRN: [id number] DIONNE KNOOP [id number] 06/27/1951 12/08/2011      Progress Note New Patient  Subjective  Chief Complaint  Chief Complaint  Patient presents with  . Annual Exam    physical    HPI  Patient is a 61 year old Caucasian female who is in today for annual exam. Overall she is doing relatively well. She continues to follow with OB/GYN for her Paps. Does her mammograms with [facility]. Denies any complaints in that regard. Has been trying to lose weight and has been increasing more and eating better. She has had some issues abdominal pain was found to have a left ovarian cysts. She denies any reflux, chest pain, fevers, chills, GI complaints otherwise. Does not show significantly with allergies this year. No ear pain or headaches.  Past Medical History  Diagnosis Date  . Chicken pox as a child  . Measles as a child  . Mumps as a child  . Elevated BP 05/05/2011  . Allergic state 05/05/2011  . Cough 05/05/2011  . Ear pain 05/05/2011  . Dermatitis 05/05/2011  . Allergy     seasonal  . 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 Osteopenia 05/05/2011  . Migraine 05/13/2011  . Preventative health care 05/13/2011  . Hyperlipidemia 06/05/2011  . Reflux 12/08/2011  . Ovarian cyst 12/08/2011    Left Pain on right intermittently Dr Laural Benes of GYN is following this and next [place] is scheduled for July 2013.    Past Surgical History  Procedure Date  . Facial sugeries 2012    X 2  . 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 Eye surgery 1996    right, near sighted correction    Family History  Problem Relation Age of Onset  . Macular degeneration Mother   . Hypertension Mother   . Arthritis Mother   . Hyperlipidemia Mother   . Hypertension Father   . Heart disease Father   . Other Father     bypass surgery   . Hyperlipidemia Father   . Depression Sister     on Paxil  . Alcohol abuse Sister   . Diabetes Maternal  Grandmother   . Colon cancer Neg Hx   . Esophageal cancer Neg Hx   . Stomach cancer Neg Hx     History   Social History  . Marital Status: Married    Spouse Name: N/A    Number of Children: N/A  . Years of Education: N/A   Occupational History  . Not on file.   Social History Main Topics  . Smoking status: Never Smoker   . Smokeless tobacco: Never Used  . Alcohol Use: No     a couple ounces once or twice a week  . Drug Use: No  . Sexually Active: No   Other Topics Concern  . Not on file   Social History Narrative  . No narrative on file    Current Outpatient Prescriptions on File Prior to Visit  Medication Sig Dispense Refill  . calcium carbonate (OS-CAL) 600 MG TABS Take 600 mg by mouth 2 (two) times daily with a meal.        . multivitamin-lutein (OCUVITE-LUTEIN) CAPS Take 1 capsule by mouth daily.        . OMEGA-3 KRILL OIL PO Take 90 mg by mouth daily.          Allergies  Allergen Reactions  .  Clindamycin/Lincomycin Diarrhea    Review of Systems  Review of Systems  Constitutional: Negative for fever, chills and malaise/fatigue.  HENT: Negative for hearing loss, nosebleeds and congestion.   Eyes: Negative for discharge.  Respiratory: Negative for cough, sputum production, shortness of breath and wheezing.   Cardiovascular: Negative for chest pain, palpitations and leg swelling.  Gastrointestinal: Positive for heartburn. Negative for nausea, vomiting, abdominal pain, diarrhea, constipation and blood in stool.  Genitourinary: Negative for dysuria, urgency, frequency and hematuria.  Musculoskeletal: Negative for myalgias, back pain and falls.  Skin: Negative for rash.  Neurological: Negative for dizziness, tremors, sensory change, focal weakness, loss of consciousness, weakness and headaches.  Endo/Heme/Allergies: Negative for polydipsia. Does not bruise/bleed easily.  Psychiatric/Behavioral: Negative for depression and suicidal ideas. The patient is not  nervous/anxious and does not have insomnia.     Objective  BP 137/86  Pulse 98  Temp(Src) 100 F (37.8 C) (Temporal)  Ht 5' 4.25" (1.632 m)  Wt 164 lb 12.8 oz (74.753 kg)  BMI 28.07 kg/m2  SpO2 100%  Physical Exam  Physical Exam  Constitutional: She is oriented to person, place, and time and well-developed, well-nourished, and in no distress. No distress.  HENT:  Head: Normocephalic and atraumatic.  Right Ear: External ear normal.  Left Ear: External ear normal.  Nose: Nose normal.  Mouth/Throat: Oropharynx is clear and moist. No oropharyngeal exudate.  Eyes: Conjunctivae are normal. Pupils are equal, round, and reactive to light. Right eye exhibits no discharge. Left eye exhibits no discharge. No scleral icterus.  Neck: Normal range of motion. Neck supple. No thyromegaly present.  Cardiovascular: Normal rate, regular rhythm, normal heart sounds and intact distal pulses.   No murmur heard. Pulmonary/Chest: Effort normal and breath sounds normal. No respiratory distress. She has no wheezes. She has no rales.  Abdominal: Soft. Bowel sounds are normal. She exhibits no distension and no mass. There is no tenderness.  Musculoskeletal: Normal range of motion. She exhibits no edema and no tenderness.  Lymphadenopathy:    She has no cervical adenopathy.  Neurological: She is alert and oriented to person, place, and time. She has normal reflexes. No cranial nerve deficit. Coordination normal.  Skin: Skin is warm and dry. No rash noted. She is not diaphoretic.  Psychiatric: Mood, memory and affect normal.       Assessment & Plan  Hyperlipidemia Avoid trans fats, continue Krill oil, add Benefiber. Increase exercise  Allergic state Too much sedation with all OTC antihistamines so just tolerates. Consider nasal saline prn.  Migraine No recent trouble. Has mild Has but not migrainous. Usually responds to Tylenol as needed. Uses Ibuprofen if severe  Preventative health care Up  to date on immunizations. Follows with [facility] for [employer]. No concerns.  Ovarian cyst Is considering complete hysterectomy because they do not have a tissue diagnosis of the lesion. Will reconsider after July [place]  Scar of skin Using scar away patch and is shrinking. Did have a steroid injection as well.  Reflux Infrequent symptoms not needing any meds

## 2011-12-08 NOTE — Assessment & Plan Note (Signed)
Up to date on immunizations. Follows with [facility] for [employer]. No concerns.

## 2011-12-08 NOTE — Assessment & Plan Note (Signed)
Avoid trans fats, continue Krill oil, add Benefiber. Increase exercise

## 2011-12-08 NOTE — Assessment & Plan Note (Signed)
Too much sedation with all OTC antihistamines so just tolerates. Consider nasal saline prn.

## 2011-12-08 NOTE — Assessment & Plan Note (Signed)
No recent trouble. Has mild Has but not migrainous. Usually responds to Tylenol as needed. Uses Ibuprofen if severe

## 2011-12-08 NOTE — Assessment & Plan Note (Signed)
Infrequent symptoms not needing any meds

## 2011-12-08 NOTE — Patient Instructions (Signed)
Hypertriglyceridemia  Diet for High blood levels of Triglycerides Most fats in food are triglycerides. Triglycerides in your blood are stored as fat in your body. High levels of triglycerides in your blood may put you at a greater risk for heart disease and stroke.  Normal triglyceride levels are less than 150 mg/dL. Borderline high levels are 150-199 mg/dl. High levels are 200 - 499 mg/dL, and very high triglyceride levels are greater than 500 mg/dL. The decision to treat high triglycerides is generally based on the level. For people with borderline or high triglyceride levels, treatment includes weight loss and exercise. Drugs are recommended for people with very high triglyceride levels. Many people who need treatment for high triglyceride levels have metabolic syndrome. This syndrome is a collection of disorders that often include: insulin resistance, high blood pressure, blood clotting problems, high cholesterol and triglycerides. TESTING PROCEDURE FOR TRIGLYCERIDES  You should not eat 4 hours before getting your triglycerides measured. The normal range of triglycerides is between 10 and 250 milligrams per deciliter (mg/dl). Some people may have extreme levels (1000 or above), but your triglyceride level may be too high if it is above 150 mg/dl, depending on what other risk factors you have for heart disease.   People with high blood triglycerides may also have high blood cholesterol levels. If you have high blood cholesterol as well as high blood triglycerides, your risk for heart disease is probably greater than if you only had high triglycerides. High blood cholesterol is one of the main risk factors for heart disease.  CHANGING YOUR DIET  Your weight can affect your blood triglyceride level. If you are more than 20% above your ideal body weight, you may be able to lower your blood triglycerides by losing weight. Eating less and exercising regularly is the best way to combat this. Fat provides  more calories than any other food. The best way to lose weight is to eat less fat. Only 30% of your total calories should come from fat. Less than 7% of your diet should come from saturated fat. A diet low in fat and saturated fat is the same as a diet to decrease blood cholesterol. By eating a diet lower in fat, you may lose weight, lower your blood cholesterol, and lower your blood triglyceride level.  Eating a diet low in fat, especially saturated fat, may also help you lower your blood triglyceride level. Ask your dietitian to help you figure how much fat you can eat based on the number of calories your caregiver has prescribed for you.  Exercise, in addition to helping with weight loss may also help lower triglyceride levels.   Alcohol can increase blood triglycerides. You may need to stop drinking alcoholic beverages.   Too much carbohydrate in your diet may also increase your blood triglycerides. Some complex carbohydrates are necessary in your diet. These may include bread, rice, potatoes, other starchy vegetables and cereals.   Reduce "simple" carbohydrates. These may include pure sugars, candy, honey, and jelly without losing other nutrients. If you have the kind of high blood triglycerides that is affected by the amount of carbohydrates in your diet, you will need to eat less sugar and less high-sugar foods. Your caregiver can help you with this.   Adding 2-4 grams of fish oil (EPA+ DHA) may also help lower triglycerides. Speak with your caregiver before adding any supplements to your regimen.  Following the Diet  Maintain your ideal weight. Your caregivers can help you with a diet. Generally,   eating less food and getting more exercise will help you lose weight. Joining a weight control group may also help. Ask your caregivers for a good weight control group in your area.  Eat low-fat foods instead of high-fat foods. This can help you lose weight too.  These foods are lower in fat. Eat MORE  of these:   Dried beans, peas, and lentils.   Egg whites.   Low-fat cottage cheese.   Fish.   Lean cuts of meat, such as round, sirloin, rump, and flank (cut extra fat off meat you fix).   Whole grain breads, cereals and pasta.   Skim and nonfat dry milk.   Low-fat yogurt.   Poultry without the skin.   Cheese made with skim or part-skim milk, such as mozzarella, parmesan, farmers', ricotta, or pot cheese.  These are higher fat foods. Eat LESS of these:   Whole milk and foods made from whole milk, such as American, blue, cheddar, monterey jack, and swiss cheese   High-fat meats, such as luncheon meats, sausages, knockwurst, bratwurst, hot dogs, ribs, corned beef, ground pork, and regular ground beef.   Fried foods.  Limit saturated fats in your diet. Substituting unsaturated fat for saturated fat may decrease your blood triglyceride level. You will need to read package labels to know which products contain saturated fats.  These foods are high in saturated fat. Eat LESS of these:   Fried pork skins.   Whole milk.   Skin and fat from poultry.   Palm oil.   Butter.   Shortening.   Cream cheese.   Bacon.   Margarines and baked goods made from listed oils.   Vegetable shortenings.   Chitterlings.   Fat from meats.   Coconut oil.   Palm kernel oil.   Lard.   Cream.   Sour cream.   Fatback.   Coffee whiteners and non-dairy creamers made with these oils.   Cheese made from whole milk.  Use unsaturated fats (both polyunsaturated and monounsaturated) moderately. Remember, even though unsaturated fats are better than saturated fats; you still want a diet low in total fat.  These foods are high in unsaturated fat:   Canola oil.   Sunflower oil.   Mayonnaise.   Almonds.   Peanuts.   Pine nuts.   Margarines made with these oils.   Safflower oil.   Olive oil.   Avocados.   Cashews.   Peanut butter.   Sunflower seeds.   Soybean oil.     Peanut oil.   Olives.   Pecans.   Walnuts.   Pumpkin seeds.  Avoid sugar and other high-sugar foods. This will decrease carbohydrates without decreasing other nutrients. Sugar in your food goes rapidly to your blood. When there is excess sugar in your blood, your liver may use it to make more triglycerides. Sugar also contains calories without other important nutrients.  Eat LESS of these:   Sugar, brown sugar, powdered sugar, jam, jelly, preserves, honey, syrup, molasses, pies, candy, cakes, cookies, frosting, pastries, colas, soft drinks, punches, fruit drinks, and regular gelatin.   Avoid alcohol. Alcohol, even more than sugar, may increase blood triglycerides. In addition, alcohol is high in calories and low in nutrients. Ask for sparkling water, or a diet soft drink instead of an alcoholic beverage.  Suggestions for planning and preparing meals   Bake, broil, grill or roast meats instead of frying.   Remove fat from meats and skin from poultry before cooking.   Add spices,   herbs, lemon juice or vinegar to vegetables instead of salt, rich sauces or gravies.   Use a non-stick skillet without fat or use no-stick sprays.   Cool and refrigerate stews and broth. Then remove the hardened fat floating on the surface before serving.   Refrigerate meat drippings and skim off fat to make low-fat gravies.   Serve more fish.   Use less butter, margarine and other high-fat spreads on bread or vegetables.   Use skim or reconstituted non-fat dry milk for cooking.   Cook with low-fat cheeses.   Substitute low-fat yogurt or cottage cheese for all or part of the sour cream in recipes for sauces, dips or congealed salads.   Use half yogurt/half mayonnaise in salad recipes.   Substitute evaporated skim milk for cream. Evaporated skim milk or reconstituted non-fat dry milk can be whipped and substituted for whipped cream in certain recipes.   Choose fresh fruits for dessert instead of  high-fat foods such as pies or cakes. Fruits are naturally low in fat.  When Dining Out   Order low-fat appetizers such as fruit or vegetable juice, pasta with vegetables or tomato sauce.   Select clear, rather than cream soups.   Ask that dressings and gravies be served on the side. Then use less of them.   Order foods that are baked, broiled, poached, steamed, stir-fried, or roasted.   Ask for margarine instead of butter, and use only a small amount.   Drink sparkling water, unsweetened tea or coffee, or diet soft drinks instead of alcohol or other sweet beverages.  QUESTIONS AND ANSWERS ABOUT OTHER FATS IN THE BLOOD: SATURATED FAT, TRANS FAT, AND CHOLESTEROL What is trans fat? Trans fat is a type of fat that is formed when vegetable oil is hardened through a process called hydrogenation. This process helps makes foods more solid, gives them shape, and prolongs their shelf life. Trans fats are also called hydrogenated or partially hydrogenated oils.  What do saturated fat, trans fat, and cholesterol in foods have to do with heart disease? Saturated fat, trans fat, and cholesterol in the diet all raise the level of LDL "bad" cholesterol in the blood. The higher the LDL cholesterol, the greater the risk for coronary heart disease (CHD). Saturated fat and trans fat raise LDL similarly.  What foods contain saturated fat, trans fat, and cholesterol? High amounts of saturated fat are found in animal products, such as fatty cuts of meat, chicken skin, and full-fat dairy products like butter, whole milk, cream, and cheese, and in tropical vegetable oils such as palm, palm kernel, and coconut oil. Trans fat is found in some of the same foods as saturated fat, such as vegetable shortening, some margarines (especially hard or stick margarine), crackers, cookies, baked goods, fried foods, salad dressings, and other processed foods made with partially hydrogenated vegetable oils. Small amounts of trans fat  also occur naturally in some animal products, such as milk products, beef, and lamb. Foods high in cholesterol include liver, other organ meats, egg yolks, shrimp, and full-fat dairy products. How can I use the new food label to make heart-healthy food choices? Check the Nutrition Facts panel of the food label. Choose foods lower in saturated fat, trans fat, and cholesterol. For saturated fat and cholesterol, you can also use the Percent Daily Value (%DV): 5% DV or less is low, and 20% DV or more is high. (There is no %DV for trans fat.) Use the Nutrition Facts panel to choose foods low in   saturated fat and cholesterol, and if the trans fat is not listed, read the ingredients and limit products that list shortening or hydrogenated or partially hydrogenated vegetable oil, which tend to be high in trans fat. POINTS TO REMEMBER: YOU NEED A LITTLE TLC (THERAPEUTIC LIFESTYLE CHANGES)  Discuss your risk for heart disease with your caregivers, and take steps to reduce risk factors.   Change your diet. Choose foods that are low in saturated fat, trans fat, and cholesterol.   Add exercise to your daily routine if it is not already being done. Participate in physical activity of moderate intensity, like brisk walking, for at least 30 minutes on most, and preferably all days of the week. No time? Break the 30 minutes into three, 10-minute segments during the day.   Stop smoking. If you do smoke, contact your caregiver to discuss ways in which they can help you quit.   Do not use street drugs.   Maintain a normal weight.   Maintain a healthy blood pressure.   Keep up with your blood work for checking the fats in your blood as directed by your caregiver.  Document Released: 05/07/2004 Document Revised: 07/09/2011 Document Reviewed: 12/03/2008 [employer] Patient Information 2012 [employer]. 

## 2012-05-06 ENCOUNTER — Other Ambulatory Visit: Payer: Private Health Insurance - Indemnity

## 2012-05-06 LAB — CBC
HCT: 43.5 % (ref 36.0–46.0)
RBC: 4.45 Mil/uL (ref 3.87–5.11)
RDW: 13.8 % (ref 11.5–14.6)
WBC: 6.7 10*3/uL (ref 4.5–10.5)

## 2012-05-06 LAB — RENAL FUNCTION PANEL
Chloride: 103 mEq/L (ref 96–112)
GFR: 92.45 mL/min (ref >60.00)
Glucose, Bld: 84 mg/dL (ref 70–99)
Phosphorus: 3 mg/dL (ref 2.3–4.6)
Potassium: 4.1 mEq/L (ref 3.5–5.1)
Sodium: 139 mEq/L (ref 135–145)

## 2012-05-06 LAB — HEPATIC FUNCTION PANEL
ALT: 17 U/L (ref 0–35)
Albumin: 4.4 g/dL (ref 3.5–5.2)
Total Protein: 7.9 g/dL (ref 6.0–8.3)

## 2012-05-06 LAB — LIPID PANEL
HDL: 66.7 mg/dL (ref >39.00)
Total CHOL/HDL Ratio: 3
VLDL: 20.8 mg/dL (ref 0.0–40.0)

## 2012-05-06 LAB — LDL CHOLESTEROL, DIRECT: Direct LDL: 136.3 mg/dL

## 2012-05-13 ENCOUNTER — Encounter: Payer: Self-pay | Admitting: Family Medicine

## 2012-05-13 ENCOUNTER — Ambulatory Visit (INDEPENDENT_AMBULATORY_CARE_PROVIDER_SITE_OTHER): Payer: Private Health Insurance - Indemnity | Admitting: Family Medicine

## 2012-05-13 VITALS — BP 148/88 | HR 95 | Temp 99.2°F | Ht 64.25 in | Wt 164.8 lb

## 2012-05-13 DIAGNOSIS — IMO0001 Reserved for inherently not codable concepts without codable children: Secondary | ICD-10-CM

## 2012-05-13 DIAGNOSIS — F419 Anxiety disorder, unspecified: Secondary | ICD-10-CM

## 2012-05-13 DIAGNOSIS — R030 Elevated blood-pressure reading, without diagnosis of hypertension: Secondary | ICD-10-CM

## 2012-05-13 DIAGNOSIS — C439 Malignant melanoma of skin, unspecified: Secondary | ICD-10-CM

## 2012-05-13 DIAGNOSIS — T7840XA Allergy, unspecified, initial encounter: Secondary | ICD-10-CM

## 2012-05-13 DIAGNOSIS — K219 Gastro-esophageal reflux disease without esophagitis: Secondary | ICD-10-CM

## 2012-05-13 DIAGNOSIS — Z0000 Encounter for general adult medical examination without abnormal findings: Secondary | ICD-10-CM

## 2012-05-13 DIAGNOSIS — F329 Major depressive disorder, single episode, unspecified: Secondary | ICD-10-CM

## 2012-05-13 DIAGNOSIS — E785 Hyperlipidemia, unspecified: Secondary | ICD-10-CM

## 2012-05-13 DIAGNOSIS — R197 Diarrhea, unspecified: Secondary | ICD-10-CM

## 2012-05-13 DIAGNOSIS — F341 Dysthymic disorder: Secondary | ICD-10-CM

## 2012-05-13 DIAGNOSIS — Z23 Encounter for immunization: Secondary | ICD-10-CM

## 2012-05-13 MED ORDER — LORAZEPAM 0.5 MG PO TABS: 0.5000 mg | ORAL_TABLET | Freq: Two times a day (BID) | ORAL | Status: DC | PRN

## 2012-05-13 MED ORDER — CITALOPRAM HYDROBROMIDE 20 MG PO TABS: 20.0000 mg | ORAL_TABLET | Freq: Every day | ORAL | Status: DC

## 2012-05-13 NOTE — Patient Instructions (Addendum)

## 2012-05-13 NOTE — Assessment & Plan Note (Addendum)
Uses Allegra prn,  May continue and may use a second dose daily as needed on occasion, try Nasal saline prn

## 2012-05-13 NOTE — Assessment & Plan Note (Signed)
Up slightly, discussed need for avoiding trans fats, continue krill oil, continue activity

## 2012-05-13 NOTE — Assessment & Plan Note (Addendum)
Improved with recheck, patient under a great deal of stress, will treat her anxiety and reassess at next visit, if still elevated may need to initiate treatment

## 2012-05-13 NOTE — Assessment & Plan Note (Signed)
Following closely with Dermatology every 6 months and as needed

## 2012-05-15 ENCOUNTER — Encounter: Payer: Self-pay | Admitting: Family Medicine

## 2012-05-15 DIAGNOSIS — F410 Panic disorder [episodic paroxysmal anxiety] without agoraphobia: Secondary | ICD-10-CM

## 2012-05-15 DIAGNOSIS — R197 Diarrhea, unspecified: Secondary | ICD-10-CM

## 2012-05-15 DIAGNOSIS — F419 Anxiety disorder, unspecified: Secondary | ICD-10-CM | POA: Insufficient documentation

## 2012-05-15 HISTORY — DX: Panic disorder (episodic paroxysmal anxiety): F41.0

## 2012-05-15 HISTORY — DX: Diarrhea, unspecified: R19.7

## 2012-05-15 NOTE — Assessment & Plan Note (Addendum)
Use Ranitidine prn, avoid offending foods

## 2012-05-15 NOTE — Assessment & Plan Note (Signed)
Will start Citalopram 1o mg daily x 7 days then increase to 20 mg daily, can use Lorazepam prn. Reassess at next visit or sooner as needed

## 2012-05-15 NOTE — Progress Notes (Signed)
Patient ID: Grace Klein, female   DOB: September 08, 1950, 61 y.o.   MRN: [id number] Grace Klein [id number] 1950/10/12 05/15/2012      Progress Note New Patient  Subjective  Chief Complaint  Chief Complaint  Patient presents with  . Annual Exam    physical    HPI  Patient is a 61 year old Caucasian female who is in today for annual exam. She is under a great deal of stress. She is caring for her ailing parents in their own home. She also has to care for her husband and her own home. She has trouble concentrating and forming new memories. She is frequently having trouble settling down. She suffers from palpitations frequently. No CP/SOB/fevers/congestion/recent illness. Has no GU c/o but does note frequent loose stool, no bloody or tarry stool  Past Medical History  Diagnosis Date  . Chicken pox as a child  . Measles as a child  . Mumps as a child  . Elevated BP 05/05/2011  . Allergic state 05/05/2011  . Cough 05/05/2011  . Ear pain 05/05/2011  . Dermatitis 05/05/2011  . Allergy     seasonal  . 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 Osteopenia 05/05/2011  . Migraine 05/13/2011  . Preventative health care 05/13/2011  . Hyperlipidemia 06/05/2011  . Reflux 12/08/2011  . Ovarian cyst 12/08/2011    Left Pain on right intermittently Dr Laural Benes of GYN is following this and next [place] is scheduled for July 2013.  Marland Kitchen Diarrhea 05/15/2012  . Anxiety and depression 05/15/2012    Past Surgical History  Procedure Date  . Facial sugeries 2012    X 2  . 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 Eye surgery 1996    right, near sighted correction    Family History  Problem Relation Age of Onset  . Macular degeneration Mother   . Hypertension Mother   . Arthritis Mother   . Hyperlipidemia Mother   . Hypertension Father   . Heart disease Father   . Other Father     bypass surgery   . Hyperlipidemia Father   . Depression Sister     on Paxil  . Alcohol abuse Sister   . Diabetes Maternal  Grandmother   . Colon cancer Neg Hx   . Esophageal cancer Neg Hx   . Stomach cancer Neg Hx     History   Social History  . Marital Status: Married    Spouse Name: N/A    Number of Children: N/A  . Years of Education: N/A   Occupational History  . Not on file.   Social History Main Topics  . Smoking status: Never Smoker   . Smokeless tobacco: Never Used  . Alcohol Use: No     a couple ounces once or twice a week  . Drug Use: No  . Sexually Active: No   Other Topics Concern  . Not on file   Social History Narrative  . No narrative on file    Current Outpatient Prescriptions on File Prior to Visit  Medication Sig Dispense Refill  . fexofenadine (ALLEGRA) 30 MG tablet Take 30 mg by mouth daily.      . OMEGA-3 KRILL OIL PO Take 90 mg by mouth daily.        . calcium carbonate (OS-CAL) 600 MG TABS Take 600 mg by mouth 2 (two) times daily with a meal.        . citalopram (CELEXA) 20 MG tablet  Take 1 tablet (20 mg total) by mouth daily. 1/2 tab po daily x 7 days, then increase to 1 tab po daily  30 tablet  3    Allergies  Allergen Reactions  . Clindamycin/Lincomycin Diarrhea    Review of Systems  Review of Systems  Constitutional: Negative for fever, chills and malaise/fatigue.  HENT: Negative for hearing loss, nosebleeds and congestion.   Eyes: Negative for discharge.  Respiratory: Negative for cough, sputum production, shortness of breath and wheezing.   Cardiovascular: Negative for chest pain, palpitations and leg swelling.  Gastrointestinal: Positive for diarrhea. Negative for heartburn, nausea, vomiting, abdominal pain, constipation and blood in stool.  Genitourinary: Negative for dysuria, urgency, frequency and hematuria.  Musculoskeletal: Negative for myalgias, back pain and falls.  Skin: Negative for rash.  Neurological: Negative for dizziness, tremors, sensory change, focal weakness, loss of consciousness, weakness and headaches.  Endo/Heme/Allergies:  Negative for polydipsia. Does not bruise/bleed easily.  Psychiatric/Behavioral: Positive for depression. Negative for suicidal ideas. The patient is nervous/anxious. The patient does not have insomnia.     Objective  BP 148/88  Pulse 95  Temp 99.2 F (37.3 C) (Temporal)  Ht 5' 4.25" (1.632 m)  Wt 164 lb 12.8 oz (74.753 kg)  BMI 28.07 kg/m2  SpO2 99%  Physical Exam  Physical Exam  Constitutional: She is oriented to person, place, and time and well-developed, well-nourished, and in no distress. No distress.  HENT:  Head: Normocephalic and atraumatic.  Right Ear: External ear normal.  Left Ear: External ear normal.  Nose: Nose normal.  Mouth/Throat: Oropharynx is clear and moist. No oropharyngeal exudate.  Eyes: Conjunctivae normal are normal. Pupils are equal, round, and reactive to light. Right eye exhibits no discharge. Left eye exhibits no discharge. No scleral icterus.  Neck: Normal range of motion. Neck supple. No thyromegaly present.  Cardiovascular: Normal rate, regular rhythm, normal heart sounds and intact distal pulses.   No murmur heard. Pulmonary/Chest: Effort normal and breath sounds normal. No respiratory distress. She has no wheezes. She has no rales.  Abdominal: Soft. Bowel sounds are normal. She exhibits no distension and no mass. There is no tenderness.  Musculoskeletal: Normal range of motion. She exhibits no edema and no tenderness.  Lymphadenopathy:    She has no cervical adenopathy.  Neurological: She is alert and oriented to person, place, and time. She has normal reflexes. No cranial nerve deficit. Coordination normal.  Skin: Skin is warm and dry. No rash noted. She is not diaphoretic.  Psychiatric: Mood, memory and affect normal.       Assessment & Plan  Allergic state Uses Allegra prn,  May continue and may use a second dose daily as needed on occasion, try Nasal saline prn  Hyperlipidemia Up slightly, discussed need for avoiding trans fats,  continue krill oil, continue activity   Melanoma Following closely with Dermatology every 6 months and as needed  Elevated BP Improved with recheck, patient under a great deal of stress, will treat her anxiety and reassess at next visit, if still elevated may need to initiate treatment  Diarrhea Frequent loose stool. Add Benefiber bid and yogurt and/or Probiotics daily  Reflux Use Ranitidine prn, avoid offending foods  Preventative health care Following closely with GYN for ovarian cyst has appt in November, flu shot given today.   Anxiety and depression Will start Citalopram 1o mg daily x 7 days then increase to 20 mg daily, can use Lorazepam prn. Reassess at next visit or sooner as needed

## 2012-05-15 NOTE — Assessment & Plan Note (Signed)
Frequent loose stool. Add Benefiber bid and yogurt and/or Probiotics daily

## 2012-05-15 NOTE — Assessment & Plan Note (Signed)
Following closely with GYN for ovarian cyst has appt in November, flu shot given today.

## 2012-07-11 ENCOUNTER — Ambulatory Visit: Payer: Private Health Insurance - Indemnity | Admitting: Family Medicine

## 2012-07-13 ENCOUNTER — Ambulatory Visit: Payer: Private Health Insurance - Indemnity | Admitting: Family Medicine

## 2012-12-07 ENCOUNTER — Other Ambulatory Visit: Payer: Private Health Insurance - Indemnity

## 2012-12-14 ENCOUNTER — Encounter: Payer: Private Health Insurance - Indemnity | Admitting: Family Medicine

## 2016-05-13 ENCOUNTER — Ambulatory Visit (INDEPENDENT_AMBULATORY_CARE_PROVIDER_SITE_OTHER): Payer: BC Managed Care – PPO | Admitting: Urgent Care

## 2016-05-13 DIAGNOSIS — Z23 Encounter for immunization: Secondary | ICD-10-CM | POA: Diagnosis not present

## 2016-05-13 NOTE — Progress Notes (Signed)
Patient presents today for flu shot only jp/cma

## 2017-03-17 DIAGNOSIS — R413 Other amnesia: Secondary | ICD-10-CM | POA: Insufficient documentation

## 2017-03-17 DIAGNOSIS — F332 Major depressive disorder, recurrent severe without psychotic features: Secondary | ICD-10-CM | POA: Insufficient documentation

## 2018-04-17 NOTE — Progress Notes (Signed)
Entered in error

## 2018-11-23 ENCOUNTER — Emergency Department (HOSPITAL_COMMUNITY)
Admission: EM | Admit: 2018-11-23 | Discharge: 2018-11-23 | Disposition: A | Discharge status: Home / Self Care | Payer: Medicare Other | Attending: Emergency Medicine | Admitting: Emergency Medicine

## 2018-11-23 ENCOUNTER — Emergency Department (HOSPITAL_COMMUNITY): Payer: Medicare Other

## 2018-11-23 ENCOUNTER — Other Ambulatory Visit: Payer: Self-pay

## 2018-11-23 DIAGNOSIS — Z79899 Other long term (current) drug therapy: Secondary | ICD-10-CM | POA: Diagnosis not present

## 2018-11-23 DIAGNOSIS — Z85820 Personal history of malignant melanoma of skin: Secondary | ICD-10-CM | POA: Insufficient documentation

## 2018-11-23 DIAGNOSIS — R569 Unspecified convulsions: Secondary | ICD-10-CM | POA: Insufficient documentation

## 2018-11-23 DIAGNOSIS — R456 Violent behavior: Secondary | ICD-10-CM | POA: Diagnosis present

## 2018-11-23 LAB — COMPREHENSIVE METABOLIC PANEL
ALT: 17 U/L (ref 0–44)
AST: 27 U/L (ref 15–41)
Albumin: 4 g/dL (ref 3.5–5.0)
Alkaline Phosphatase: 89 U/L (ref 38–126)
Anion gap: 11 (ref 5–15)
BUN: 15 mg/dL (ref 8–23)
CO2: 23 mmol/L (ref 22–32)
Calcium: 9.2 mg/dL (ref 8.9–10.3)
Chloride: 107 mmol/L (ref 98–111)
Creatinine, Ser: 0.91 mg/dL (ref 0.44–1.00)
GFR calc Af Amer: >60 mL/min (ref >60)
GFR calc non Af Amer: >60 mL/min (ref >60)
Glucose, Bld: 127 mg/dL — ABNORMAL HIGH (ref 70–99)
Potassium: 3.1 mmol/L — ABNORMAL LOW (ref 3.5–5.1)
Sodium: 141 mmol/L (ref 135–145)
Total Bilirubin: 1 mg/dL (ref 0.3–1.2)
Total Protein: 7.3 g/dL (ref 6.5–8.1)

## 2018-11-23 LAB — CBG MONITORING, ED: Glucose-Capillary: 104 mg/dL — ABNORMAL HIGH (ref 70–99)

## 2018-11-23 LAB — URINALYSIS, ROUTINE W REFLEX MICROSCOPIC
Bilirubin Urine: NEGATIVE
Glucose, UA: NEGATIVE mg/dL
Ketones, ur: 5 mg/dL — AB
Leukocytes,Ua: NEGATIVE
Nitrite: NEGATIVE
Protein, ur: NEGATIVE mg/dL
Specific Gravity, Urine: 1.014 (ref 1.005–1.030)
pH: 5 (ref 5.0–8.0)

## 2018-11-23 LAB — CBC
HCT: 42.6 % (ref 36.0–46.0)
Hemoglobin: 14.4 g/dL (ref 12.0–15.0)
MCH: 31.9 pg (ref 26.0–34.0)
MCHC: 33.8 g/dL (ref 30.0–36.0)
MCV: 94.5 fL (ref 80.0–100.0)
Platelets: 259 10*3/uL (ref 150–400)
RBC: 4.51 MIL/uL (ref 3.87–5.11)
RDW: 12.4 % (ref 11.5–15.5)
WBC: 13.6 10*3/uL — ABNORMAL HIGH (ref 4.0–10.5)
nRBC: 0 % (ref 0.0–0.2)

## 2018-11-23 NOTE — ED Provider Notes (Signed)
[facility] EMERGENCY DEPARTMENT Provider Note   CSN: [id number] Arrival date & time: 11/23/18  1244    History   Chief Complaint Chief Complaint  Patient presents with   Behavior Problem   Level 5 caveat: Altered mental status  HPI Grace Klein is a 68 y.o. female.     HPI 68 year old female who is brought to the emergency department with noted combative behavior at the house requiring 2.5 mg of IM Haldol for EMS.  After further discussion with the patient's spouse it sounds as though she was in her normal state of health and was in the restroom when he began hearing abnormal sounds from the restroom.  When he entered the restroom he states she was laying on the floor with foaming at the mouth.  No seizure activity noted.  No prior history of seizures.  No recent change in her medications.  No recent fevers.  He states she has a longstanding history of depression and psychiatric illness but that this was completely outside of her norm today.       Past Medical History:  Diagnosis Date   Allergic state 05/05/2011   Allergy    seasonal   Anxiety and depression 05/15/2012   Cancer 11-11   skin cance on bridge of nose, melanoma   Chicken pox as a child   Cough 05/05/2011   Dermatitis 05/05/2011   Diarrhea 05/15/2012   Ear pain 05/05/2011   Elevated BP 05/05/2011   Hyperlipidemia 06/05/2011   Measles as a child   Migraine 05/13/2011   Mumps as a child   Osteopenia 05/05/2011   Ovarian cyst 12/08/2011   Left Pain on right intermittently Dr Wynetta Emery of GYN is following this and next [place] is scheduled for July 2013.   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      Home Medications    Prior to Admission medications   Medication Sig Start Date End Date Taking? Authorizing Provider  beta carotene w/minerals (OCUVITE) tablet Take 1 tablet by mouth daily.    [provider]  calcium carbonate (OS-CAL) 600 MG TABS Take 600 mg by mouth 2 (two) times daily with a meal.      [provider]  citalopram (CELEXA) 20 MG tablet Take 1 tablet (20 mg total) by mouth daily. 1/2 tab po daily x 7 days, then increase to 1 tab po daily 05/13/12   Mosie Lukes, MD  fexofenadine (ALLEGRA) 30 MG tablet Take 30 mg by mouth  daily.    [provider]  LORazepam (ATIVAN) 0.5 MG tablet Take 1 tablet (0.5 mg total) by mouth 2 (two) times daily as needed for anxiety (& sleep). 05/13/12   Mosie Lukes, MD  OMEGA-3 KRILL OIL PO Take 90 mg by mouth daily.      [provider]    Family History Family History  Problem Relation Age of Onset   Macular degeneration Mother    Hypertension Mother    Arthritis Mother    Hyperlipidemia Mother    Hypertension Father    Heart disease Father    Other Father        bypass surgery    Hyperlipidemia Father    Depression Sister        on Paxil   Alcohol abuse Sister    Diabetes Maternal Grandmother    Colon cancer Neg Hx    Esophageal cancer Neg Hx    Stomach cancer Neg Hx     Social  History Social History   Tobacco Use   Smoking status: Never Smoker   Smokeless tobacco: Never Used  Substance Use Topics   Alcohol use: No    Comment: a couple ounces once or twice a week   Drug use: No     Allergies   Clindamycin/lincomycin   Review of Systems Review of Systems  Unable to perform ROS: Mental status change     Physical Exam Updated Vital Signs BP 133/73    Pulse (!) 105    Temp (S) 97.8 F (36.6 C) (Rectal)    Resp (!) 23    Ht 5' 2.5" (1.588 m)    Wt 74.8 kg    SpO2 100%    BMI 29.68 kg/m   Physical Exam Vitals signs and nursing note reviewed.  Constitutional:      General: She is not in acute distress.    Appearance: She is well-developed.  HENT:     Head: Normocephalic and atraumatic.  Eyes:     Pupils: Pupils are equal, round, and reactive to light.  Neck:     Musculoskeletal: Normal range of motion.  Cardiovascular:     Rate and Rhythm: Normal rate and regular rhythm.     Heart sounds: Normal heart sounds.  Pulmonary:     Effort: Pulmonary effort is normal.     Breath sounds: Normal breath sounds.  Abdominal:     General: There is no distension.     Palpations: Abdomen is soft.     Tenderness: There is no abdominal tenderness.  Musculoskeletal: Normal range of motion.  Skin:    General: Skin is warm and dry.  Neurological:     General: No focal deficit present.     Mental Status: She is alert.     Comments: Moves all 4 extremities equally.  Follows simple commands.  Strong grip bilaterally.  Psychiatric:     Comments: Altered mental status, unable to test      ED Treatments / Results  Labs (all labs ordered are listed, but only abnormal results are displayed) Labs Reviewed  CBC - Abnormal; Notable for the following components:      Result Value   WBC 13.6 (*)    All other components within normal limits  COMPREHENSIVE METABOLIC PANEL - Abnormal; Notable for the following components:   Potassium 3.1 (*)    Glucose, Bld  127 (*)    All other components within normal limits  URINALYSIS, ROUTINE W REFLEX MICROSCOPIC -  Abnormal; Notable for the following components:   APPearance HAZY (*)    Hgb urine dipstick MODERATE (*)    Ketones, ur 5 (*)    Bacteria, UA RARE (*)    All other components within normal limits  CBG MONITORING, ED - Abnormal; Notable for the following components:   Glucose-Capillary 104 (*)    All other components within normal limits    EKG EKG Interpretation  Date/Time:  Wednesday November 23 2018 12:50:36 EDT Ventricular Rate:  110 PR Interval:    QRS Duration: 76 QT Interval:  349 QTC Calculation: 473 R Axis:   29 Text Interpretation:  Sinus tachycardia Nonspecific repol abnormality, diffuse leads No significant change was found Confirmed by Jola Schmidt [phone]) on 11/24/2018 8:24:53 AM   Radiology Ct Head Wo Contrast  Result Date: 11/23/2018 CLINICAL DATA:  Altered mental status with combative behavior EXAM: CT HEAD WITHOUT CONTRAST TECHNIQUE: Contiguous axial images were obtained from the base of the skull through the vertex without intravenous contrast. COMPARISON:  None. FINDINGS: Brain: There is age related volume loss. There is no intracranial mass, hemorrhage, extra-axial fluid collection, or midline shift. There is slight small vessel disease in the centra semiovale bilaterally. Elsewhere brain parenchyma appears unremarkable. No acute infarct is evident. Vascular: There is no appreciable hyperdense vessel. There is calcification in each carotid siphon region. Skull: The bony calvarium appears intact. Sinuses/Orbits: There is mild mucosal thickening in several ethmoid air cells. Other visualized paranasal sinuses are clear. There is slight rightward deviation of the nasal septum. Orbits appear symmetric bilaterally. Other: Mastoid air cells are clear. IMPRESSION: Age related volume loss with mild periventricular small vessel disease. No acute infarct. No mass or hemorrhage. There  are foci of arterial vascular calcification. There is mild mucosal thickening in several ethmoid air cells. There is mild nasal septal deviation. Electronically Signed   By: Lowella Grip III M.D.   On: 11/23/2018 13:42    Procedures Procedures (including critical care time)  Medications Ordered in ED Medications - No data to display   Initial Impression / Assessment and Plan / ED Course  I have reviewed the triage vital signs and the nursing notes.  Pertinent labs & imaging results that were available during my care of the patient were reviewed by me and considered in my medical decision making (see chart for details).        Observed in the emergency department.  Mental status continued to improve.  This is likely new onset seizure with postictal state noted by EMS with associated postictal agitation.  She is much better at this time.  She spoke with her husband on the phone and he states that she is back to normal and actually is speaking more than she normally does.  No focal neuro deficit.  Head CT without acute abnormality.  Labs without significant findings.  I spoke with the husband at length and he understands the importance of close outpatient neurology follow-up and was given standard seizure precautions.  No indication for additional testing or hospitalization at this time.  Patient stable for discharge from the emergency department.  Patient and family encouraged to return to the emergency department for any new or worsening symptoms  Final Clinical Impressions(s) / ED Diagnoses   Final diagnoses:  Seizure [facility])    ED Discharge Orders    None       Jola Schmidt, MD 11/24/18 [phone]

## 2018-11-23 NOTE — ED Triage Notes (Signed)
Pt BIB [facility] for combative behavior. Per EMS patient's husband found her like this today. Pt previously had a reaction similar to this  when she took benzodiazepines. Pt is not on any new prescriptions and is not prescribed benzodiazepines per EMS. Pt is alert and oriented to person only. Pt does appear very fidgety and restless upon arrival to ED. Pt was given 2.5mg  of IM haldol by EMS prior to arrival to the ED. At baseline patient is alert and oriented x4 per EMS.

## 2018-11-23 NOTE — ED Notes (Signed)
Patient given discharge instructions and follow up information. Pt given the opportunity to ask questions. Pt verbalized understanding. This RN also spoke with patient's husband and gave him discharge instructions and follow up information. He verbalized understanding.

## 2018-11-23 NOTE — ED Notes (Signed)
Patient transported to CT 

## 2018-11-30 ENCOUNTER — Other Ambulatory Visit: Payer: Self-pay

## 2018-12-05 ENCOUNTER — Other Ambulatory Visit: Payer: Self-pay

## 2018-12-05 ENCOUNTER — Telehealth (INDEPENDENT_AMBULATORY_CARE_PROVIDER_SITE_OTHER): Payer: Medicare Other | Admitting: Neurology

## 2018-12-05 VITALS — BP 136/79

## 2018-12-05 DIAGNOSIS — F0391 Unspecified dementia with behavioral disturbance: Secondary | ICD-10-CM

## 2018-12-05 DIAGNOSIS — R569 Unspecified convulsions: Secondary | ICD-10-CM | POA: Diagnosis not present

## 2018-12-05 DIAGNOSIS — F419 Anxiety disorder, unspecified: Secondary | ICD-10-CM

## 2018-12-05 DIAGNOSIS — F03B18 Unspecified dementia, moderate, with other behavioral disturbance: Secondary | ICD-10-CM

## 2018-12-05 NOTE — Progress Notes (Signed)
Virtual Visit via Video Note The purpose of this virtual visit is to provide medical care while limiting exposure to the novel coronavirus.    Consent was obtained for video visit:  Yes.   Answered questions that patient had about telehealth interaction:  Yes.   I discussed the limitations, risks, security and privacy concerns of performing an evaluation and management service by telemedicine. I also discussed with the patient that there may be a patient responsible charge related to this service. The patient expressed understanding and agreed to proceed.  Pt location: Home Physician Location: office Name of referring provider:  Mosie Lukes, MD I connected with Grace Klein at patients initiation/request on 12/05/2018 at  1:00 PM EDT by video enabled telemedicine application and verified that I am speaking with the correct person using two identifiers. Pt MRN:  [id number] Pt DOB:  1951/04/20 Video Participants:  Grace Klein;  Tinia Oravec (husband)   History of Present Illness:  This is a 68 year old right-handed woman with a history of hypertension, debilitating anxiety, and dementia, presenting for evaluation of an episode concerning for seizure last 11/23/2018. Her husband provides the history, she has word-finding difficulties and slowed responses to questions. He relates her complicated psychiatric history over the past 6 years, and eventual diagnosis of dementia, she has been dealing with significant anxiety where she would have significant panic attacks. These worsened around March 2020 until she was started on hydroxyzine. Since then, she has been calmer on the outside, but "inside she is a nervous wreck still." For the 2 days prior to the episode on 4/22, they had people fixing their roof and not sleeping well. Everything was disrupted and very loud, she had a her first small panic attack in weeks the day prior, then on 4/22, he came home and hear an odd moan from the bathroom, he found  her on the floor gurgling, her right hand seemed cupped and shaking a little. When EMS arrived within 5 minutes, she was looking around but out of it, very incoherent and combative needing a dose of Haldol. She was brought to [facility] where she was reportedly back to baseline. Bloodwork was unremarkable except for mildly elevated WBC 13.6. I personally reviewed head CT, no acute changes, mild diffuse atrophy. Her husband reports that when he brought her home, he felt she was more alert and conscious of what was going on. They deny any prior history of seizures. Sometimes when she is very nervous she seems distanced and he has to repeat himself a few times, like her mind is somewhere else. He reports an incident in April 2019 where she had altered mental status after receiving her 6th ECT session. She had an EEG on 11/16/17 which showed generalized 6 Hz theta slowing. She had improved mental status and repeat EEG was done on 11/18/17 which continued to show 5 Hz theta slowing. There is note of NMDA receptor Ab test sent, however results are unavailable for review.  She has had a complicated psychiatric history which her husband went into great detail today. He started noticing her go downhill 6 years ago when she was taking care of her elderly parents. He started helping her and when her mother passed away 4 years ago, he noticed that her favorite comment was "I cannot do this anymore, I cannot do anything right." At that time she also became estranged with her son and he states "she hit rock bottom at that point." The more he  did for her, the less she would do. He reports she has not cleaned the house or cooked for several years. He started helping her shower and get dressed 9-10 months ago. She mostly watches TV. When she started having significant anxiety, she would barely go out, since starting hydroxyzine 5 weeks ago, she now goes out with him to feed their animals but still gets very anxious and shaky when she takes  a shower or puts on her shoes. He relates one time he gently reminded her to put on her seatbelt when the car was moving and she immediately became Klein anxious she was trying to get out of the car. He notes that on a good/calm day, she does much better overall. She has started seeing psychiatrist Dr. Martina Sinner. He reports that she had been evaluated by neurologist Dr. Dimas Millin in 2014, bloodwork normal, MRI brain no acute changes, mild chronic microvascular disease. At some point she started going to [facility] where she was told she had heavy metal poisoning and was prescribed 40 pills to take daily for over a year. He reports that he did independent testing which indicated no evidence of heavy metals. [facility] by Dr. Jimmye Norman in 2018 and it was felt that cognitive issues were likely due to pervasive difficulties with attention and effortful processing. She was referred to Psychiatry and started undergoing ECT for severe depression in 2019. Her husband feels that she was more alert after the first 2 sessions, but after she received a benzodiazepine with the 3rd session, she became very combative (similar to agitation after recent episode last month). She would get worse 18-20 hours after her ECT sessions, hallucinating and out of it. Ultimately after continued follow-up with Psychiatry, she has been diagnosed with dementia instead of pseudodementia.   She states her memory is good. She is slow to respond, sometimes needing repeated prompting to answer questions. She denies any headaches, dizziness, vision changes, neck/back pain, bowel/bladder dysfunction. She states she has had left hand numbness recently. She states she takes her own medications, her husband corrects her and she says yes he gives them to me. Her husband manages finances and medications, she does not drive. Sleep is good. It appears she has not been hallucinating since her last ECT  treatment in 2019. She denies any olfactory/gustatory hallucinations, deja vu, rising epigastric sensation, myoclonic jerks. She had a normal birth and early development.  There is no history of febrile convulsions, CNS infections such as meningitis/encephalitis, significant traumatic brain injury, neurosurgical procedures, or family history of seizures.  PAST MEDICAL HISTORY: Past Medical History:  Diagnosis Date  . Allergic state 05/05/2011  . Allergy    seasonal  . Anxiety and depression 05/15/2012  . Cancer ([place]) 11-11   skin cance on bridge of nose, melanoma  . Chicken pox as a child  . Cough 05/05/2011  . Dermatitis 05/05/2011  . Diarrhea 05/15/2012  . Ear pain 05/05/2011  . Elevated BP 05/05/2011  . Hyperlipidemia 06/05/2011  . Measles as a child  . Migraine 05/13/2011  . Mumps as a child  . Osteopenia 05/05/2011  . Ovarian cyst 12/08/2011   Left Pain on right intermittently Dr Wynetta Emery of GYN is following this and next [place] is scheduled for July 2013.  Marland Kitchen Preventative health care 05/13/2011  . Reflux 12/08/2011    PAST SURGICAL HISTORY: Past Surgical History:  Procedure Laterality Date  . [place]   right, near  sighted correction  . facial sugeries  2012   X 2  . polyp removal    . 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 amLODipine (NORVASC) 5 MG tablet Take 5 mg by mouth daily.    . BUSPIRONE HCL PO Take 150 mcg by mouth daily.    . hydrOXYzine (VISTARIL) 50 MG capsule Take 50 mg by mouth 3 (three) times daily.    . Multiple Vitamin (MULTIVITAMIN) tablet Take 1 tablet by mouth daily.     No current facility-administered medications on file prior to visit.     ALLERGIES: Allergies  Allergen Reactions  . Benzodiazepines Other (See Comments)    Pt has had to have prolonged hospitalizations after benzos and would prefer not to receive them Confusion, agitation and  visual hallucinations  Confusion, agitation and visual hallucinations    . Clindamycin/Lincomycin Diarrhea    Severe Severe     FAMILY HISTORY: Family History  Problem Relation Age of Onset  . Macular degeneration Mother   . Hypertension Mother   . Arthritis Mother   . Hyperlipidemia Mother   . Hypertension Father   . Heart disease Father   . Other Father        bypass surgery   . Hyperlipidemia Father   . Depression Sister        on Paxil  . Alcohol abuse Sister   . Diabetes Maternal Grandmother   . Colon cancer Neg Hx   . Esophageal cancer Neg Hx   . Stomach cancer Neg Hx       Observations/Objective:   Vitals:   12/05/18 1252  BP: 136/79   GEN:  The patient appears stated age and is in NAD. She is slow to respond to questions, with flat affect. Her husband does most of the talking, she is noted to be looking off to the side of the camera. She states the date is 04/[birth year]. She knows the city. She can name a pen but cannot name a paperclip. She can repeat short phrases but has difficulty repeating more complicated phrases. Decreased fluency. Unable to do serial 7s. 0/3 delayed recall. Cranial nerves: There is good facial symmetry. Soft palate rises symmetrically and there is no tongue deviation. Hearing is intact to conversational tone. Motor: Strength is at least antigravity x 4.  There is no pronator drift. No incoordination on finger to nose testing. Gait narrow-based and steady, mild difficulty with tandem walk.  Assessment and Plan:   This is a 68 year old right-handed woman with a history of hypertension, severe depression and anxiety, dementia of unclear etiology, with an episode concerning for new onset seizure last 11/23/2018. We discussed doing an MRI brain with and without contrast and a 1-hour EEG as part of seizure workup. We discussed that after an initial seizure, unless there are significant risk factors, an abnormal neurological exam, an EEG showing epileptiform  abnormalities, and/or abnormal neuroimaging, treatment with an antiepileptic drug is not indicated. We discussed 10% of the population may have a single seizure. Patients with a single unprovoked seizure have a recurrence rate of 33% after a single seizure and 73% after a second seizure. She has had a complicated psychiatric history and it is difficult to determine if cognitive deficits are due to underlying psychological condition as previously suspected, or if psychological changes are part of a neurodegenerative condition. I discussed with husband that with symptoms ongoing  for more than 6 years, etiology may be difficult to determine, she may not be able to participate with Neuropsychological testing due to significant anxiety. Continue working with Dr. Casimiro Needle, if anxiety is not a significant barrier in the future, we may do Neuropsych testing. They will follow-up after tests and know to call for any changes.   Follow Up Instructions:   -I discussed the assessment and treatment plan with the patient/husband. The patient/husband were provided an opportunity to ask questions and all were answered. The patient/husband agreed with the plan and demonstrated an understanding of the instructions.   The patient/husband were advised to call back or seek an in-person evaluation if the symptoms worsen or if the condition fails to improve as anticipated.   Cameron Sprang, MD

## 2018-12-07 ENCOUNTER — Other Ambulatory Visit: Payer: Self-pay

## 2018-12-07 DIAGNOSIS — R569 Unspecified convulsions: Secondary | ICD-10-CM

## 2018-12-16 ENCOUNTER — Ambulatory Visit (INDEPENDENT_AMBULATORY_CARE_PROVIDER_SITE_OTHER): Payer: Medicare Other | Admitting: Neurology

## 2018-12-16 ENCOUNTER — Other Ambulatory Visit: Payer: Self-pay

## 2018-12-16 DIAGNOSIS — R569 Unspecified convulsions: Secondary | ICD-10-CM | POA: Diagnosis not present

## 2018-12-16 NOTE — Procedures (Signed)
ELECTROENCEPHALOGRAM REPORT  Date of Study: 12/16/2018  Patient's Name: Grace Klein MRN: [id number] Date of Birth: [birth year]  Referring Provider: Dr. Ellouise Newer  Clinical History: This is a 68 year old woman with history of early onset dementia, new onset seizure on 11/23/2018.   Medications: Amlodipine Buspar Hydroxyzine  Technical Summary: A multichannel digital 1-hour EEG recording measured by the international 10-20 system with electrodes applied with paste and impedances below 5000 ohms performed as portable with EKG monitoring in an awake and drowsy patient.  Hyperventilation was not performed. Photic stimulation was performed.  The digital EEG was referentially recorded, reformatted, and digitally filtered in a variety of bipolar and referential montages for optimal display.   Description: The patient is awake and drowsy during the recording. There is no clear posterior dominant rhythm. The background consists of a large amount of diffuse 4.5-5 Hz theta slowing with slight increase in background slowing with drowsiness. Deeper stages of sleep were not seen. Photic stimulation did not elicit any abnormalities.  There were no epileptiform discharges or electrographic seizures seen.    EKG lead was unremarkable.  Impression: This 1-hour awake and drowsy EEG is abnormal due to mild to moderate diffuse slowing of the waking background.  Clinical Correlation of the above findings indicates diffuse cerebral dysfunction that is non-specific in etiology and can be seen with hypoxic/ischemic injury, toxic/metabolic encephalopathies, neurodegenerative disorders, or medication effect.  The absence of epileptiform discharges does not rule out a clinical diagnosis of epilepsy.  Clinical correlation is advised.   Ellouise Newer, M.D.

## 2018-12-21 ENCOUNTER — Telehealth: Payer: Self-pay

## 2018-12-21 NOTE — Telephone Encounter (Signed)
Spoke with pt husband given results per Dr Delice Lesch EEG did not show any seizure discharges. It is not like a pregnancy test that is positive or negative, it is only a snapshot of her brain waves. Proceed with MRI brain as scheduled. Pt husband verbalized understanding no questions asked

## 2019-01-16 ENCOUNTER — Ambulatory Visit
Admission: RE | Admit: 2019-01-16 | Discharge: 2019-01-16 | Disposition: A | Discharge status: Home / Self Care | Payer: Private Health Insurance - Indemnity | Source: Ambulatory Visit | Attending: Neurology | Admitting: Neurology

## 2019-01-16 ENCOUNTER — Other Ambulatory Visit: Payer: Self-pay | Admitting: Neurology

## 2019-01-16 ENCOUNTER — Other Ambulatory Visit: Payer: Self-pay

## 2019-01-16 DIAGNOSIS — R569 Unspecified convulsions: Secondary | ICD-10-CM

## 2019-01-17 ENCOUNTER — Telehealth: Payer: Self-pay

## 2019-01-17 NOTE — Telephone Encounter (Signed)
Spoke to pt husband informed that although Chane was not able to sit through the whole MRI, we were able to get enough information, there is no evidence of tumor, stroke, or bleed. It showed age-related changes. At this point, would recommend doing the Neurocognitive testing (in-depth memory testing) once Dr. Melvyn Novas joins [place] in Aug/Sept, in the meantime, continue working with Dr. Casimiro Needle on anxiety. Pt husband verbalized understanding, no questions at this time, advised to call our office if they have any questions or concerns

## 2019-05-02 ENCOUNTER — Telehealth: Payer: Self-pay | Admitting: Neurology

## 2019-05-02 DIAGNOSIS — F419 Anxiety disorder, unspecified: Secondary | ICD-10-CM

## 2019-05-02 DIAGNOSIS — F0391 Unspecified dementia with behavioral disturbance: Secondary | ICD-10-CM

## 2019-05-02 DIAGNOSIS — F03B18 Unspecified dementia, moderate, with other behavioral disturbance: Secondary | ICD-10-CM

## 2019-05-02 DIAGNOSIS — R569 Unspecified convulsions: Secondary | ICD-10-CM

## 2019-05-02 NOTE — Telephone Encounter (Signed)
Yes please, thanks

## 2019-05-02 NOTE — Telephone Encounter (Signed)
Spoke to Dr. Casimiro Needle and he stated patient's husband was asking about her neuropsych testing that has been discussed with Dr. Delice Lesch. Informed Dr. Casimiro Needle that one provider is here and the other one coming soon and I told him I will call patient's husband now and follow up on this.  Called husband and told him that I will look into it  and either front desk or I will call him back tomorrow with an update. He verbalized understanding.   Dr. Delice Lesch - I see your office note mentions it 5/4 for neuropsych testing if anxiety is not a significant barrier. Heather's telephone note 6/16 mentions neurocognitive testing once provider arrives.  Would you like me to put in the order for neurocognitive testing with Dr. Melvyn Novas for this patient?

## 2019-05-02 NOTE — Telephone Encounter (Signed)
Dr. Margaret Pyle from [facility] called requesting a call back from a nurse to discuss this patient.

## 2019-05-03 NOTE — Addendum Note (Signed)
Addended by: Jesse Fall on: 05/03/2019 02:50 PM   Modules accepted: Orders

## 2019-05-03 NOTE — Telephone Encounter (Addendum)
Entered order for neuropsych testing. Staff message sent to front desk to ask about scheduling and will call husband back today with info.  Called husband and let him know that our office has the order and once the insurance for patient/Dr. Melvyn [facility] is approved the front desk will call and set up appt. Husband was appreciative of call.

## 2019-06-15 ENCOUNTER — Ambulatory Visit (INDEPENDENT_AMBULATORY_CARE_PROVIDER_SITE_OTHER): Payer: Medicare Other | Admitting: Psychology

## 2019-06-15 ENCOUNTER — Encounter: Payer: Self-pay | Admitting: Psychology

## 2019-06-15 ENCOUNTER — Other Ambulatory Visit: Payer: Self-pay

## 2019-06-15 ENCOUNTER — Ambulatory Visit: Payer: Private Health Insurance - Indemnity

## 2019-06-15 DIAGNOSIS — R4189 Other symptoms and signs involving cognitive functions and awareness: Secondary | ICD-10-CM | POA: Diagnosis not present

## 2019-06-15 DIAGNOSIS — F411 Generalized anxiety disorder: Secondary | ICD-10-CM

## 2019-06-15 DIAGNOSIS — F332 Major depressive disorder, recurrent severe without psychotic features: Secondary | ICD-10-CM

## 2019-06-15 DIAGNOSIS — F410 Panic disorder [episodic paroxysmal anxiety] without agoraphobia: Secondary | ICD-10-CM | POA: Diagnosis not present

## 2019-06-15 DIAGNOSIS — R569 Unspecified convulsions: Secondary | ICD-10-CM

## 2019-06-15 NOTE — Progress Notes (Addendum)
NEUROPSYCHOLOGICAL EVALUATION [place] Department of Neurology  Reason for Referral:   Grace [facility] is a 68 y.o. Caucasian female referred by [facility], M.D., to characterize her current cognitive functioning and assist with diagnostic clarity and treatment planning in the context of significant psychiatric comorbidities, possible seizure history, and subjective cognitive decline.  Assessment and Plan:   Clinical Impression(s): Across the current evaluation, Grace [facility] was unable to complete nearly all cognitive tests, including those designed to assess her level of engagement and the overall validity of cognitive testing. This is uncommon as even individuals with established and moderate stages of various dementing illnesses are generally able to at least complete these tests. As such, the results of the current evaluation are not believed to represent a valid assessment of her cognitive abilities. Likewise, mood-related questionnaires were unable to be completed due to Grace [facility] being unable to understand questions being asked of her or respond appropriately. For an in-depth explanation of test performance, please refer to the Test Results section at the bottom of this report.   While difficult to discern, it is my suspicion that Grace Klein' ongoing psychiatric distress is the primary culprit for her clinical presentation at this time. According to Grace Klein' husband, deficits were said to emerge following a particularly stressful and traumatic time in her life, where she assumed the primary caregiver role for her parents in declining health. She eventually experienced the traumatic loss of not only her mother, but also her son and grandchildren due to poor family dynamics which resulted after her mother's passing. It is at this point that her husband noted a precipitous decline in her cognitive and functional status, with Grace [facility] stating "I cannot do this  anymore, I cannot do anything right." It is possible that growing symptoms of anxiety and depression stemming from this event eventually became wholly debilitating, leading to Grace Klein current presentation. However, this is speculative at the present time and warrants a more formalized psychological evaluation.   Grace Klein' husband reported that a prior evaluation in 2018 with Grace Klein, M.D., Ph.D. at a memory assessment clinic suggested that cognitive deficits were likely due to pervasive difficulties with attention and effortful processing. While the current evaluation did not yield valid data, I am inclined to agree with this assessment in that I believe that severe psychiatric distress in the form of depression and anxiety has created significant difficulties with information processing and attention/concentration. Deficits in these cognitive domains likely lead to deficits in other domains. Her pattern of speech and decline of the past 4 years (reported her husband) could suggest a language-variant of frontotemporal dementia. However, if cognitive deficits were valid, I would expect to see significant atrophy in her temporal and frontal lobes, while recent neuroimaging was largely unremarkable. Continuing medical monitoring moving forward will be important.   Recommendations: Optimizing her mental health, in my opinion, should be Grace Klein and her husband's top priority moving forward. Ideally, Grace Klein would be referred for individual psychotherapy to discuss traumatic events throughout her life and the subsequent distress which these have caused. However, presently I do not feel that Grace Klein would be able to meaningful engage in high-level discourse given her current presentation. As such, she is encouraged to continue care with her psychiatrist and work towards continued optimization of her psychiatric conditions.  In the mean time, I encourage Grace Klein and her husband to monitor her  emotional status. One recommendation could be the  completion of ABC charts, where they identify a specific behavior of interest (B) and attempt to understand the antecedents (A) that led to this behavior and the consequences (C). If completed over an extended period of time, it can become easier for patterns to emerge to help identify when her clinical presentation tends to improve or deteriorate.  If possible, Grace Klein is also encouraged to consider journaling or writing down various thoughts and emotions. Identifying relaxing or pleasurable activities (e.g., going for walks, listening to music) can also be beneficial in reducing anxiety.  It will be important for Grace Klein to have another person with her when in situations where she may need to process information, weigh the pros and cons of different options, and make decisions, in order to ensure that she fully understands and recalls all information to be considered.  Review of Records:   Grace Klein was seen by Grace Klein, M.D.) on 05/15/2012 for her annual exam. At that time, Grace Klein noted that Grace Klein appeared to be under a large amount of stress caring for her ailing parents in their own home. She reported trouble concentrating and forming new memories, as well as emerging symptoms of anxiety. This is the first encounter where these difficulties were raised.  On 11/23/2018, Grace Klein was brought to the [facility] ED with noted combative behavior. Records suggest that Grace Klein was found making abnormal sounds from the restroom and was found by her husband lying on the floor and foaming at the mouth. No seizure activity was said to be reported. She was observed in the ED and her mental status appeared to improve over time. Head CT at that time was negative. On 12/05/2018, Grace Klein was seen by [facility], M.D.) for an evaluation stemming from her prior possible seizure event. Her husband provided  Grace Klein history. As this information was also stated during the current interview, it will not be duplicated here. Please see following sections for additional details. Ultimately, Ms. Scism was referred for a neuropsychological evaluation to characterize her cognitive abilities and to assist with diagnostic clarity and treatment planning.  EEG on 11/16/2017 showed generalized 6Hz  theta slowing. This was repeated on 11/18/2017, which continued to show 5Hz  theta slowing. Head CT on 11/23/2018 revealed age-related volume loss and mild periventricular small vessel disease. Brain MRI on 01/16/2019 revealed generalized atrophy and mild chronic small vessel disease.  Past Medical History:  Diagnosis Date   Allergic state 05/05/2011   Allergy    seasonal   Chicken pox as a child   Cough 05/05/2011   Dermatitis 05/05/2011   Diarrhea 05/15/2012   Ear pain 05/05/2011   Elevated BP 05/05/2011   Generalized anxiety disorder with panic attacks 05/15/2012   Hyperlipidemia 06/05/2011   Measles as a child   Melanoma 06/2010   Bridge of nose   Migraine 05/13/2011   Mumps as a child   Osteopenia 05/05/2011   Ovarian cyst 12/08/2011   Left Pain on right intermittently Dr Wynetta Emery of GYN is following this and next [place] is scheduled for July 2013.   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    Family History  Problem Relation Age of Onset   Macular degeneration Mother  Hypertension Mother    Arthritis Mother    Hyperlipidemia Mother    Hypertension Father    Heart disease Father    Other Father        bypass surgery    Hyperlipidemia Father    Depression Sister        on Paxil   Alcohol abuse Sister    Diabetes Maternal Grandmother    Colon cancer Neg Hx    Esophageal cancer Neg Hx    Stomach cancer Neg  Hx      Current Outpatient Medications:    amLODipine (NORVASC) 5 MG tablet, Take 5 mg by mouth daily., Disp: , Rfl:    BUSPIRONE HCL PO, Take 150 mcg by mouth daily., Disp: , Rfl:    hydrOXYzine (VISTARIL) 50 MG capsule, Take 50 mg by mouth 3 (three) times daily., Disp: , Rfl:    Multiple Vitamin (MULTIVITAMIN) tablet, Take 1 tablet by mouth daily., Disp: , Rfl:   Clinical Interview:   History of Presenting Concerns: History was provided by Ms. Fella' husband. He started noticing a decline in Ms. Blaydes functioning approximately 6 years prior while she was taking care of her elderly parents. After her mother passed approximately 4 years ago, he noted Ms. Shidler commonly stating "I cannot do this anymore, I cannot do anything right." At that time, family dynamics also led to her becoming estranged from her son and grandchildren. At that point, Mr. Noa said that his wife "hit rock bottom." Anxiety symptoms and panic attacks emerged and became debilitating. Mr. Bruton noted that the more he would assist her, the less she became able to do things her own. Currently, he reported assisting her with all instrumental ADLs, as well as some basic ADLs (e.g., dressing, as well as bathing as Ms. Hayden experiences anxiety being alone). At some point in time, she started meeting with a provider at [facility], where she was told she had heavy metal poisoning and was prescribed a very large number of pills. This continued for 1-1.5 years until Mr. Ballengee performed independent testing, which indicated no evidence of heavy metals.  In 2018, Ms. Beneke was evaluated by Grace [facility], M.D., Ph.D. at a memory assessment clinic. Cognitive deficits were said to likely be due to pervasive difficulties with attention and effortful processing; these records were unavailable for review. She was referred to Psychiatry and started undergoing ECT for severe depression in 2019. Mr. Litwiller noted that while the  first 2 sessions went well, she received a benzodiazepine medication following her 3rd session, causing notable agitation and combativeness. Post-ECT hallucinations were also reported. In total, Ms. Richert completed 6 ECT sessions. During the latter, she was also said to have a negative experience, was taken to the [facility] ED, and potentially inadvertently given more benzodiazepine medications. She again became combative (possibly due to symptoms of delirium) and Mr. Adger was told by a "third year resident" that his wife has "full-blown dementia" and that no treatment could be provided at this point. Notably, since starting hydroxyzine in around March 2019, anxiety symptoms were said to be somewhat improved, yet still debilitating.  Cognitive Symptoms: (Note: outside of denying short-term memory difficulties, Ms. Furtick was largely unable to provide answers to interview questions. Information was obtained via her husband.) Decreased short-term memory: While Ms. Doster denied memory difficulties, her husband described notable symptoms of confusion which have been gradually worsening over time. Decreased long-term memory: Denied. Decreased attention/concentration: Endorsed. Difficulties were said to surround  maintaining her focus, ease of distractibility, and losing her train of thought.  Reduced processing speed: Endorsed. Difficulties with executive functions: Endorsed. Difficulties were said to include complex planning and organization, as well as indecisiveness. Impulsivity was denied. Difficulties with emotion regulation: Denied. Difficulties with receptive language: Endorsed. Difficulties with word finding: Endorsed. Additionally, Mr. Sweat noted a decline in articulation over the past 4 years. Decreased visuoperceptual ability: Denied.  Difficulties completing ADLs: Endorsed. As stated above, Mr. Bauwens assists with some basic ADLs, as well as all instrumental ADLs. Ms. Emmett does not  currently drive due to ongoing cognitive and psychiatric deficits.   Additional Medical History: History of traumatic brain injury/concussion: Denied. History of stroke: Denied. History of seizure activity: Unclear. As stated above, records suggest possible seizure activity on 11/23/2018 where Ms. Cheramie was found by her husband lying on the floor and foaming at the mouth. During the current interview, Mr. Hephner noted that several people had been working on their roof throughout the day while he left to run errands. Upon returning home, he reported his belief that he startled Ms. Gentzler to the extent that she experienced a panic attack, rather than seizure onset. No subsequent seizure activity was noted.  History of known exposure to toxins: Denied. Symptoms of chronic pain: Denied. Experience of frequent headaches/migraines: Denied. A remote history of headaches was reported. Frequent instances of dizziness/vertigo: Endorsed. These symptoms were generally related to anxiety exacerbations. A history of falls was denied.  Sensory changes: Ms. Stonehouse wears corrective lenses with positive effect. No other sensory changes (e.g., hearing, taste, or smell) were reported. Balance/coordination difficulties: Denied. Other motor difficulties: Denied.  Sleep History: Estimated hours obtained each night: 12 hours. Difficulties falling asleep: Denied. Difficulties staying asleep: Denied. Feels rested and refreshed upon awakening: Denied. Her husband noted that she comments about being tired throughout the day.  History of snoring: Denied. History of waking up gasping for air: Denied. Witnessed breath cessation while asleep: Denied.  History of vivid dreaming: Denied. Excessive movement while asleep: Denied. Instances of acting out her dreams: Denied. A history of these symptoms were acknowledged; however, these apparently stopped after medication changes.   Psychiatric/Behavioral Health  History: Depression: Unclear. Ms. Meis was unable to answer questioning regarding her psychiatric history, stating that she did not know if she had ever been diagnosed with depression. 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 As stated above, she also completed a total of 6 ECT treatments in an attempt to treat this condition. She described her current mood as "not great." Current or remote suicidal ideation, intent, or plan was denied. Anxiety: Endorsed. Please see above sections for additional information. Mania: Denied. Trauma History: Denied. Visual/auditory hallucinations: Denied outside medication or ECT side effects. Delusional thoughts: Denied.  Tobacco: Denied. Alcohol: Ms. Strunk denied current alcohol consumption, as well as a history of problematic alcohol use, abuse, or dependence.  Recreational drugs: Denied. Caffeine: 1 cup of coffee in the morning.  Academic/Vocational History: Highest level of educational attainment: 16 years. Notably, Ms. Lickteig was unable to identify that she graduated from college and, upon being told this, was unable to identify what her degree was in. 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  History of developmental delay: Denied. History of grade repetition:  Denied. History of class failures: Denied. Enrollment in special education courses: Denied. History of diagnosed specific learning disability: Denied. History of ADHD: Denied.  Employment: Unemployed. She previously worked as a Pharmacist, [employer] in various capacities, as well as in [employer].   Evaluation Results:   Behavioral Observations: Ms. Vivirito was accompanied by her husband, arrived to her appointment on time, and was appropriately dressed  and groomed. Observed gait and station were within normal limits. Gross motor functioning appeared intact upon informal observation and no abnormal movements (e.g., tremors) were noted. She appeared nervous during the clinical interview, made poor eye contact, and commonly picked at her clothes while being asked questions. Spontaneous speech was non-fluent, brief in nature, and occasionally jumbled to where her speech was difficult to understand. Word finding difficulties were also noteworthy.  During testing, Ms. Fixico was unable to complete a majority of the tests, either due to the inability to understand task instructions, or becoming tangential and attempting to tell stories unrelated to the task at hand. She occasionally exhibited a childlike demeanor, especially during confrontation naming tasks. For example, when shown a picture of a giraffe, she attempted to physically mimic the behaviors of a giraffe. When shown a skunk, she became quite animated, making a disgusted expression and stating "yuck," before eventually identifying the item correctly. Additionally behavioral observations are noted in the following section.  Adequacy of Effort: The validity of neuropsychological testing is limited by the extent to which the individual being tested may be assumed to have exerted adequate effort during testing. Ms. Everard was unable to complete nearly all cognitive tests, including those designed to assess her level of engagement and the validity of testing. For example, on a word choice test, Ms. Gockley repeated "I don't know" rather than selecting an option, causing this test to be discontinued. She was also unable to count dots on a piece of paper, causing another test to be discontinued. This is uncommon as even individuals with severe cognitive impairment are generally able to complete these tests. As such, the results of the current evaluation are not believed to represent a valid assessment of her  cognitive abilities.   Test Results: Ms. Reagle was very disoriented at the time of the current evaluation. She was unable to state her age, birthday, address (including city), and phone number. She was also unable to state the current year, month, date, day of the week, time, current location, current city, or current state.   Intellectual abilities based upon educational and vocational attainment were estimated to be in the average range. Premorbid abilities were estimated to be within the well below average range based upon a single-word reading test.   Processing speed was unable to be adequately assessed. When presented with a test asking her to draw a line from numbers 1 to 25 as quickly as possible, she first appeared as though she was unclear how to grasp the pen. Upon instruction, she was unable to perform any aspects of this measure. An oral measure was attempted, but Ms. Eiland was unable to verbally count from 1 to 25 independently.    Basic attention was impaired. Executive functioning (including working memory) was unable to be assessed as Ms. Trippe could not comprehend task instructions adequately.  Assessed receptive language abilities were not directly assessed. However, Ms. Lader appeared to exhibit notable difficulties understanding task instructions, leading to a majority of these assessments to be discontinued prematurely.  Assessed expressive language (e.g., verbal fluency and confrontation naming) was unable  to be adequately assessed. On a phonemic fluency test, she stated the name of the current practice [employer]) when asked to state words that begin with the letter F. When asked to state words that begin with A, she stated "to seek" prior to the test being discontinued. On tests assessing semantic fluency, she was unable to provide any responses. Confrontation naming was impaired.    Assessed visuospatial/visuoconstructional abilities were impaired. When asked to draw a clock,  she drew what appeared to be two side-by-side rectangles and stated that this drawing represented her sister. She was unable to copy a display of 6 basic geometric shapes. Her copy of a complex figure included the letter F with an elongated upper and middle horizontal line, followed by the letter A underneath.    Learning (i.e., encoding) of novel verbal and visual information was impaired. When learning a list of words, she was unable to recall any words across all 4 learning trials; she did state "AB" and "five." When learning a story, she was only able to repeat back "the story in the city." Retrieval of this information was negligible as she likely did not learn this information in the first place. Across a list learning recognition task, she responded to questions by stating "yes or no" rather than one option or the other.    Psychological functioning was unable to be adequately assessed. When asked to provide yes or no responses to questions assessing acute depressive symptoms, Ms. Szczesny was unable to provide adequate responses. For example, when asked "Are you basically satisfied with your life?" she responded stating "no, well yes, the honey grand thing." Likewise, when asked "Do you feel that your life is empty?, she responded "some and then some, go, go, go, go." When asked "Are you in good spirits most of the time?", she responded "and there at the there." The questionnaire was discontinued after this item; others were not attempted.  Informed Consent and Coding/Compliance:   Ms. Phelan was provided with a verbal description of the nature and purpose of the present neuropsychological evaluation. Also reviewed were the foreseeable risks and/or discomforts and benefits of the procedure, limits of confidentiality, and mandatory reporting requirements of this provider. The patient was given the opportunity to ask questions and receive answers about the evaluation. Oral consent to participate was  provided by the patient.   This evaluation was conducted by Christia Reading, Ph.D., licensed clinical neuropsychologist. Ms. Veeder completed a 40-minute clinical interview, billed as one unit [phone], and 60 minutes of cognitive testing, billed as one unit [phone] and one additional unit [phone]. Psychometrist Cruzita Lederer, B.S., assisted Dr. Melvyn Novas with test administration and scoring procedures. As a separate and discrete service, Dr. Melvyn Novas spent a total of 180 minutes in interpretation and report writing, billed as one unit 96132 and two units 96133.

## 2019-06-15 NOTE — Progress Notes (Addendum)
   Neuropsychology Note   EARICA CZYZEWSKI completed 70 minutes of neuropsychological testing with technician, Cruzita Lederer, B.S., under the supervision of Dr. Christia Reading, Ph.D., licensed neuropsychologist. The patient did not appear overtly distressed by the testing session, per behavioral observation or via self-report to the technician. Rest breaks were offered.    In considering the patient's current level of functioning, level of presumed impairment, nature of symptoms, emotional and behavioral responses during the interview, level of literacy, and observed level of motivation/effort, a battery of tests was selected and communicated to the psychometrician.   Communication between the psychologist and technician was ongoing throughout the testing session and changes were made as deemed necessary based on patient performance on testing, technician observations and additional pertinent factors such as those listed above.   Malka So will return within approximately two weeks for an interactive feedback session with Dr. Melvyn Novas at which time his test performances, clinical impressions, and treatment recommendations will be reviewed in detail. The patient understands she can contact our office should she require our assistance before this time.   Full report to follow.  60 minutes were spent face-to-face with patient administering standardized tests and 15 minutes were spent scoring (technician). [CPT [id number], [id number]

## 2019-06-27 ENCOUNTER — Ambulatory Visit (INDEPENDENT_AMBULATORY_CARE_PROVIDER_SITE_OTHER): Payer: Medicare Other | Admitting: Psychology

## 2019-06-27 ENCOUNTER — Other Ambulatory Visit: Payer: Self-pay

## 2019-06-27 ENCOUNTER — Encounter: Payer: Self-pay | Admitting: Psychology

## 2019-06-27 DIAGNOSIS — F332 Major depressive disorder, recurrent severe without psychotic features: Secondary | ICD-10-CM

## 2019-06-27 DIAGNOSIS — R4189 Other symptoms and signs involving cognitive functions and awareness: Secondary | ICD-10-CM

## 2019-06-27 DIAGNOSIS — F411 Generalized anxiety disorder: Secondary | ICD-10-CM

## 2019-06-27 DIAGNOSIS — F410 Panic disorder [episodic paroxysmal anxiety] without agoraphobia: Secondary | ICD-10-CM

## 2019-06-27 NOTE — Progress Notes (Signed)
   Neuropsychology Feedback Session Grace Klein. [facility] Department of Neurology  Reason for Referral:   Grace Klein a 68 y.o. Caucasian female referred by Ellouise Newer, M.D.,to characterize hercurrent cognitive functioning and assist with diagnostic clarity and treatment planning in the context of significant psychiatric comorbidities, possible seizure history, and subjective cognitive decline.  Feedback:   [name] completed a comprehensive neuropsychological evaluation on 06/15/2019. Please refer to that encounter for the full report. Briefly, [name] was unable to complete nearly all cognitive tests, including those designed to assess her level of engagement and the overall validity of cognitive testing. While difficult to discern, it is my suspicion that [name]' ongoing psychiatric distress is the primary culprit for her clinical presentation at this time. According to [name]' husband, deficits were said to emerge following a particularly stressful and traumatic time in her life, where she assumed the primary caregiver role for her parents in declining health. She eventually experienced the traumatic loss of not only her mother, but also her son and grandchildren due to poor family dynamics which resulted after her mother's passing. It is at this point that her husband noted a precipitous decline in her cognitive and functional status, with [name] stating "I cannot do this anymore, I cannot do anything right." It is possible that growing symptoms of anxiety and depression stemming from this event eventually became wholly debilitating, leading to [name] current presentation. However, this is speculative at the present time and warrants a more formalized psychological evaluation.  [name] was accompanied by her husband during the current virtual visit. Content of the current session focused on the results of the evaluation and potential psychiatric  underpinnings of her presentation. [name] and her husband were given the opportunity to ask questions and their questions were answered. They were also encouraged to reach out should additional questions arise. A copy of her report was mailed to the address on file at the conclusion of the visit.      A total of 25 minutes were spent with [name] and her husband during the current feedback session.

## 2019-11-30 ENCOUNTER — Emergency Department (HOSPITAL_COMMUNITY)
Admission: EM | Admit: 2019-11-30 | Discharge: 2019-11-30 | Disposition: A | Discharge status: Home / Self Care | Payer: Medicare Other | Attending: Emergency Medicine | Admitting: Emergency Medicine

## 2019-11-30 ENCOUNTER — Other Ambulatory Visit: Payer: Self-pay

## 2019-11-30 ENCOUNTER — Encounter (HOSPITAL_COMMUNITY): Payer: Self-pay | Admitting: Emergency Medicine

## 2019-11-30 ENCOUNTER — Emergency Department (HOSPITAL_COMMUNITY): Payer: Medicare Other

## 2019-11-30 DIAGNOSIS — R4182 Altered mental status, unspecified: Secondary | ICD-10-CM | POA: Insufficient documentation

## 2019-11-30 DIAGNOSIS — R251 Tremor, unspecified: Secondary | ICD-10-CM | POA: Diagnosis not present

## 2019-11-30 DIAGNOSIS — Z79899 Other long term (current) drug therapy: Secondary | ICD-10-CM | POA: Insufficient documentation

## 2019-11-30 DIAGNOSIS — R000 Tachycardia, unspecified: Secondary | ICD-10-CM | POA: Insufficient documentation

## 2019-11-30 DIAGNOSIS — Z85820 Personal history of malignant melanoma of skin: Secondary | ICD-10-CM | POA: Diagnosis not present

## 2019-11-30 DIAGNOSIS — I10 Essential (primary) hypertension: Secondary | ICD-10-CM | POA: Insufficient documentation

## 2019-11-30 LAB — COMPREHENSIVE METABOLIC PANEL
ALT: 17 U/L (ref 0–44)
AST: 26 U/L (ref 15–41)
Albumin: 4.1 g/dL (ref 3.5–5.0)
Alkaline Phosphatase: 83 U/L (ref 38–126)
Anion gap: 15 (ref 5–15)
BUN: 16 mg/dL (ref 8–23)
CO2: 21 mmol/L — ABNORMAL LOW (ref 22–32)
Calcium: 9.4 mg/dL (ref 8.9–10.3)
Chloride: 106 mmol/L (ref 98–111)
Creatinine, Ser: 0.97 mg/dL (ref 0.44–1.00)
GFR calc Af Amer: >60 mL/min (ref >60)
GFR calc non Af Amer: >60 mL/min (ref >60)
Glucose, Bld: 149 mg/dL — ABNORMAL HIGH (ref 70–99)
Potassium: 3.3 mmol/L — ABNORMAL LOW (ref 3.5–5.1)
Sodium: 142 mmol/L (ref 135–145)
Total Bilirubin: 1.1 mg/dL (ref 0.3–1.2)
Total Protein: 7.1 g/dL (ref 6.5–8.1)

## 2019-11-30 LAB — CBC
HCT: 43.4 % (ref 36.0–46.0)
Hemoglobin: 14.2 g/dL (ref 12.0–15.0)
MCH: 30.5 pg (ref 26.0–34.0)
MCHC: 32.7 g/dL (ref 30.0–36.0)
MCV: 93.1 fL (ref 80.0–100.0)
Platelets: 273 10*3/uL (ref 150–400)
RBC: 4.66 MIL/uL (ref 3.87–5.11)
RDW: 13.5 % (ref 11.5–15.5)
WBC: 17.6 10*3/uL — ABNORMAL HIGH (ref 4.0–10.5)
nRBC: 0 % (ref 0.0–0.2)

## 2019-11-30 MED ORDER — SODIUM CHLORIDE 0.9 % IV BOLUS
1000.0000 mL | Freq: Once | INTRAVENOUS | Status: AC
  Administered 2019-11-30: 14:00:00 1000 mL via INTRAVENOUS

## 2019-11-30 NOTE — ED Notes (Signed)
Psychiatrist who follows her case would like a call with updates, will also call later for follow up. Dr. Milta Deiters [phone]

## 2019-11-30 NOTE — Discharge Instructions (Addendum)
Please return to the emergency department if she any new symptoms appear or worsening of her symptoms.  Encourage fluids by mouth.  Please take her home medications as prescribed

## 2019-11-30 NOTE — ED Triage Notes (Signed)
Pt BIB [facility] from psych doctor's office. While getting TMS pt had seizure-like activity that lasted 30 sec. Pt agitated and combative with ems. 10mg  Haldol given in route. Husband reports pt had similar episode 6 months ago, had CT, and MRI, was not dx with seizures.

## 2019-11-30 NOTE — ED Provider Notes (Signed)
Grace Klein   CSN: [id number] Arrival date & time: 11/30/19  1133     History Chief Complaint  Patient presents with  . Seizures    Grace Klein is a 69 y.o. female.  HPI  Level 5 caveat secondary to altered mental status 69 year old female with report of new onset of seizure today while receiving transcranial magnetic stimulation for depression.  Call received from Dr. Casimiro Needle, who sees the patient for depression and was administering treatments.  He reports that the patient had a grand mal seizure.  Per report the seizure activity lasted approximately 30 seconds.  EMS reports that the patient was confused on their arrival and was agitated and combative.  She is reported to have an paradoxical reaction to benzodiazepines.  She received 10 mg of Haldol prehospital.  On arrival here she is unable to answer my questions on initial evaluation    Past Medical History:  Diagnosis Date  . Allergic state 05/05/2011  . Allergy    seasonal  . Chicken pox as a child  . Cough 05/05/2011  . Dermatitis 05/05/2011  . Diarrhea 05/15/2012  . Ear pain 05/05/2011  . Elevated BP 05/05/2011  . Generalized anxiety disorder with panic attacks 05/15/2012  . Hyperlipidemia 06/05/2011  . Measles as a child  . Melanoma 06/2010   Bridge of nose  . Migraine 05/13/2011  . Mumps as a child  . Osteopenia 05/05/2011  . Ovarian cyst 12/08/2011   Left Pain on right intermittently Dr Wynetta Emery of GYN is following this and next [place] is scheduled for July 2013.  Marland Kitchen Reflux 12/08/2011    Patient Active Problem List   Diagnosis Date Noted  . Severe episode of recurrent major depressive disorder, without psychotic features ([place]) 03/17/2017  . Diarrhea 05/15/2012  . Generalized anxiety disorder with panic attacks 05/15/2012  . Reflux 12/08/2011  . Ovarian cyst 12/08/2011  . Hyperlipidemia 06/05/2011  . 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 Melanoma ([place])  05/13/2011  . Elevated BP 05/05/2011  . Allergic state 05/05/2011  . Dermatitis 05/05/2011  . Osteopenia 05/05/2011  . Essential hypertension 05/05/2011    Past Surgical History:  Procedure Laterality Date  . EYE SURGERY  1996   right, near sighted correction  . facial sugeries  2012   X 2  . polyp removal    . 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     Family History  Problem Relation Age of Onset  . Macular degeneration Mother   . Hypertension Mother   . Arthritis Mother   . Hyperlipidemia Mother   . Hypertension Father   . Heart disease Father   . Other Father        bypass surgery   . Hyperlipidemia Father   . Depression Sister        on Paxil  . Alcohol abuse Sister   . Diabetes Maternal Grandmother   . Colon cancer Neg Hx   . Esophageal cancer Neg Hx   . Stomach cancer Neg Hx     Social History   Tobacco Use  . Smoking status: Never Smoker  . Smokeless tobacco: Never Used  Substance Use Topics  . Alcohol use: No    Comment: a couple ounces once or twice a week  . Drug use: No    Home Medications Prior to Admission medications   Medication Sig Start Date End Date Taking?  Authorizing Provider  amLODipine (NORVASC) 5 MG tablet Take 5 mg by mouth daily.    [provider]  BUSPIRONE HCL PO Take 150 mcg by mouth daily.    [provider]  hydrOXYzine (VISTARIL) 50 MG capsule Take 50 mg by mouth 3 (three) times daily.    [provider]  Multiple Vitamin (MULTIVITAMIN) tablet Take 1 tablet by mouth daily.    [provider]    Allergies    Benzodiazepines and Clindamycin/lincomycin  Review of Systems   Review of Systems  All other systems reviewed and are negative.   Physical Exam Updated Vital Signs BP 130/64   Pulse (!) 116   Temp (!) 97.3 F (36.3 C) (Axillary)   Resp (!) 23   Ht 1.651 m (5\' 5" )   Wt 72.6 kg   SpO2 94%   BMI 26.63 kg/m   Physical Exam  Vitals and nursing Klein reviewed.  Constitutional:      Appearance: Normal appearance.  HENT:     Head: Normocephalic.     Right Ear: External ear normal.     Left Ear: External ear normal.     Nose: Nose normal.     Mouth/Throat:     Mouth: Mucous membranes are moist.  Eyes:     Extraocular Movements: Extraocular movements intact.     Pupils: Pupils are equal, round, and reactive to light.  Musculoskeletal:     Cervical back: Normal range of motion.  Neurological:     Mental Status: She is alert.     ED Results / Procedures / Treatments   Labs (all labs ordered are listed, but only abnormal results are displayed) Labs Reviewed  CBC - Abnormal; Notable for the following components:      Result Value   WBC 17.6 (*)    All other components within normal limits  COMPREHENSIVE METABOLIC PANEL - Abnormal; Notable for the following components:   Potassium 3.3 (*)    CO2 21 (*)    Glucose, Bld 149 (*)    All other components within normal limits    EKG EKG Interpretation  Date/Time:  Thursday November 30 2019 11:38:05 EDT Ventricular Rate:  121 PR Interval:    QRS Duration: 76 QT Interval:  302 QTC Calculation: 429 R Axis:   53 Text Interpretation: Sinus tachycardia Nonspecific repol abnormality, diffuse leads No significant change since last tracing Confirmed by Pattricia Boss [phone]) on 11/30/2019 12:58:20 PM   Radiology CT Head Wo Contrast  Result Date: 11/30/2019 CLINICAL DATA:  Seizure, nontraumatic. EXAM: CT HEAD WITHOUT CONTRAST TECHNIQUE: Contiguous axial images were obtained from the base of the skull through the vertex without intravenous contrast. COMPARISON:  Brain MRI 01/16/2019, head CT 11/23/2018 FINDINGS: Brain: Minimal ill-defined hypodensity within the cerebral white matter is nonspecific, but consistent with chronic small vessel ischemic disease. Mild generalized parenchymal atrophy. Findings are stable as compared to prior MRI 01/16/2019. There is no acute  intracranial hemorrhage. No demarcated cortical infarct. No extra-axial fluid collection. No evidence of intracranial mass. No midline shift. Vascular: No hyperdense vessel. Skull: Normal. Negative for fracture or focal lesion. Sinuses/Orbits: Visualized orbits show no acute finding. No significant paranasal sinus disease or mastoid effusion at the imaged levels. IMPRESSION: 1. No evidence of acute intracranial abnormality. 2. Stable, mild generalized parenchymal atrophy and chronic small vessel ischemic disease. Electronically Signed   By: Kellie Simmering DO   On: 11/30/2019 14:33    Procedures Procedures (including critical care time)  Medications  Ordered in ED Medications - No data to display  ED Course  I have reviewed the triage vital signs and the nursing notes.  Pertinent labs & imaging results that were available during my care of the patient were reviewed by me and considered in my medical decision making (see chart for details). 1:17 PM Patient reexamined.  Husband is at bedside.  He states that she had a similar episode in the past.  Review of chart reveals that she had a similar episode in April 2020.  He states that she was in the bathroom at home.  When he entered she had some foaming of the mouth.  He did not see any evidence of seizure activity.  At that time, she also received Haldol prehospital.  Here in the emergency department, she had continued improving of her mental status and was evaluated with a head CT.  She was discharged with seizure precautions.  He reports that she eventually had an EEG that did not show anything.  He has not seen any similar episodes until today.  He states that he was with her today during this episode.  He states that they had been doing the TMS at 100 and increased to 120.  She complained of pain.  She had some stiffening of her extremities for about 30 seconds.  She was then not responsive for several minutes.  She was then screaming and yelling. Her  husband feels that this may be secondary to her anxiety.  He states that she did not get her normal morning dose of Vistaril. I have reviewed her labs her white blood cell count is elevated at 17,600 and glucose is elevated at 149.  The rest of her complete metabolic panel and complete blood count are within normal limits. Head CT is pending.  We will continue to observe. Currently, he states that she is similar to baseline but somewhat slowed. She does follow my instructions and her strength appears equal.  She does not give me any verbal detail. Clinical Course as of Nov 30 1439  Thu Nov 30, 2019  1439 CT results and images reviewed   [DR]  1440 CT Head Wo Contrast [DR]    Clinical Course User Index [DR] Pattricia Boss, MD   MDM Rules/Calculators/A&P                       Patient presents today with possible seizure vs anxiety Per her husband, her current neurological status is at baseline.  Follows his instructions but does not currently speak to me directly.  Her husband feels that this is her normal severe anxiety. Work-up here reveals elevated white blood cell count with no definitive etiology.  Patient is not febrile.  This could be accounted for by having had a seizure or prior anxiety.  I do not find any evidence of acute infection or any other etiologies that require further intervention Patient is also somewhat tachycardic here.  Her husband states this is usually baseline for her.  Review of prior records reveals last time she was here her heart rate was 111 and is currently 114.  She appears stable from this perspective. Husband states she had a similar episode a year ago.  At that time she was seen for an outpatient work-up and it was not felt to be a seizure.  I discussed possible treatment with antiseizure medications and the fact that this is unclear diagnosis today.  Does not wish to start antiseizure medications  at this time.  We have discussed return precautions and need for  follow-up and he voices understanding. Final Clinical Impression(s) / ED Diagnoses Final diagnoses:  Episode of shaking    Rx / DC Orders ED Discharge Orders    None       Pattricia Boss, MD 11/30/19 1459

## 2019-11-30 NOTE — ED Notes (Signed)
Patient transported to CT 

## 2020-01-29 ENCOUNTER — Telehealth: Payer: Self-pay | Admitting: Neurology

## 2020-01-29 NOTE — Telephone Encounter (Signed)
Patient's psychiatrist, Dr. Reece Levy, called and left a message requesting a call back from Dr. Delice Lesch about this mutual patient.

## 2020-01-29 NOTE — Telephone Encounter (Signed)
Spoke to Dr. Reece Levy. She is severely depressed. Dr. Reece Levy is doing [place] on her, she has a very bad posture, extreme anxiety, during the 4th treatment session, she had what appeared to be seizure-like activity, not the classic tonic-clonic, confused. Husband described she has had episodes like this, appeared to be a severe panic attack, triggered by the fact it was a new tech she did not know who was abrupt. Due to this, protocol now is to get neurological clearance that on risk benefit analysis it is okay to continue [place]. He really believes that it is has helped her be the normal [place] he knows. Will schedule appt with patient/husband to discuss another EEG before proceeding with [place].

## 2020-01-31 ENCOUNTER — Other Ambulatory Visit: Payer: Self-pay

## 2020-01-31 ENCOUNTER — Telehealth (INDEPENDENT_AMBULATORY_CARE_PROVIDER_SITE_OTHER): Payer: Medicare Other | Admitting: Neurology

## 2020-01-31 ENCOUNTER — Encounter: Payer: Self-pay | Admitting: Neurology

## 2020-01-31 VITALS — Ht 66.0 in | Wt 150.0 lb

## 2020-01-31 DIAGNOSIS — F410 Panic disorder [episodic paroxysmal anxiety] without agoraphobia: Secondary | ICD-10-CM | POA: Diagnosis not present

## 2020-01-31 DIAGNOSIS — F332 Major depressive disorder, recurrent severe without psychotic features: Secondary | ICD-10-CM

## 2020-01-31 DIAGNOSIS — F411 Generalized anxiety disorder: Secondary | ICD-10-CM

## 2020-01-31 DIAGNOSIS — R4189 Other symptoms and signs involving cognitive functions and awareness: Secondary | ICD-10-CM | POA: Diagnosis not present

## 2020-01-31 DIAGNOSIS — R404 Transient alteration of awareness: Secondary | ICD-10-CM

## 2020-01-31 NOTE — Progress Notes (Signed)
Virtual Visit via Video Note The purpose of this virtual visit is to provide medical care while limiting exposure to the novel coronavirus.    Consent was obtained for video visit:  Yes.   Answered questions that patient had about telehealth interaction:  Yes.   I discussed the limitations, risks, security and privacy concerns of performing an evaluation and management service by telemedicine. I also discussed with the patient that there may be a patient responsible charge related to this service. The patient expressed understanding and agreed to proceed.  Pt location: Home Physician Location: office Name of referring provider:  Josefa Klein* I connected with Grace Klein at patients initiation/request on 01/31/2020 at  4:00 PM EDT by video enabled telemedicine application and verified that I am speaking with the correct person using two identifiers. Pt MRN:  [id number] Pt DOB:  1951-07-04 Video Participants:  Grace Klein;  Grace Klein (spouse)   History of Present Illness:  The patient was seen as a virtual video visit on 01/31/2020. She was last seen a year ago after an episode concerning for seizure in April 2020. She has a complicated psychiatric history for the past 6 years. She has significant anxiety with panic attacks. She had an episode in April where he heard an odd moan and found her on the floor gurgling and shaking a little. She was very incoherent and combative, needing Haldol. Head CT no acute changes, she has had at least 2 EEGs in the past showing diffuse theta slowing. Records since her last visit were reviewed. She had an MRI brain without contrast but was unable to tolerate completion of study, there was no acute changes seen, there was generalized atrophy and mild chronic microvascular disease. Her EEG in 12/2018 showed mild to moderate diffuse background slowing. She had Neuropsychological testing in 06/2019, unable to complete nearly all cognitive tests, including  those designed to assess her level of engagement and overall validity of cognitive testing. This is uncommon as even individuals with established and moderate stages of various dementing illnesses are generally able to at least complete these tests. While difficult to discern, it was felt that her ongoing psychiatric distress is the primary culprit for her clinical presentation at this time. Severe psychiatric distress in the form of depression and anxiety has created significant difficulties with information processing and attention/concentration.   She presents today after I received a call from her psychiatrist regarding events surrounding [place] for severe depression. During her 4th treatment session, had what appeared to be seizure-like activity, not the classic tonic-clonic, she was confused. Her husband had reported it appeared to be a severe panic attack, triggered by the fact that it was a new technician who was being abrupt with the patient. She presents today to discuss event before proceeding with [place], where she needs neurological clearance now. She is again confused during the visit, with echolalia. She reports feeling dizzy sometimes. Her husband reports good and bad nights of sleep, 3/4 of the time she sleeps pretty good, every once in a while she wakes up very anxious at 1AM and he gives her medication and she goes back to sleep. Her husband is quite upset with the last [place] session, he was seeing a little bit of improvement with focusing and concentration, but her anxiety is still "off the roof." He recounts her experience, she would wince a little with the stimulation with the prior technician, but with the new one she had that day, Shaneika hollered, jerked,  saying it hurt. She let out a scream, the tech took her helmet off then put it back on, then she let out a scream and passed out. She was not jerking, it took her 3-5 minutes to start coming around and she was combative, anxious, and scared. He  states that she did not have her Vistaril that morning and he found out that the stimulation setting was higher than what she was used to. He states her body was not stiff, her arms were out but not shaking. There was very little gurgling saliva compared to the first time. He recounts the first episode in April 2020 where there was also a lot going on at home that was provoking anxiety. Grace Klein expressed frustration that he is very upset with the medical establishment. He wants to give up himself. Grace Klein sits and stares at the floor, her anxiety is out of the roof. Showers are difficult, she gets panic and does not want to close the door. She is also battling depression big time. When he gets her to calm down, she can do things. She has difficulty following commands during today's visit, mumbling to herself. Her husband reports her concentration and focus are just not there.    History on Initial Assessment 12/05/2018: This is a 69 year old right-handed woman with a history of hypertension, debilitating anxiety, and dementia, presenting for evaluation of an episode concerning for seizure last 11/23/2018. Her husband provides the history, she has word-finding difficulties and slowed responses to questions. He relates her complicated psychiatric history over the past 6 years, and eventual diagnosis of dementia, she has been dealing with significant anxiety where she would have significant panic attacks. These worsened around March 2020 until she was started on hydroxyzine. Since then, she has been calmer on the outside, but "inside she is a nervous wreck still." For the 2 days prior to the episode on 4/22, they had people fixing their roof and not sleeping well. Everything was disrupted and very loud, she had a her first small panic attack in weeks the day prior, then on 4/22, he came home and hear an odd moan from the bathroom, he found her on the floor gurgling, her right hand seemed cupped and shaking a little. When  EMS arrived within 5 minutes, she was looking around but out of it, very incoherent and combative needing a dose of Haldol. She was brought to [facility] where she was reportedly back to baseline. Bloodwork was unremarkable except for mildly elevated WBC 13.6. I personally reviewed head CT, no acute changes, mild diffuse atrophy. Her husband reports that when he brought her home, he felt she was more alert and conscious of what was going on. They deny any prior history of seizures. Sometimes when she is very nervous she seems distanced and he has to repeat himself a few times, like her mind is somewhere else. He reports an incident in April 2019 where she had altered mental status after receiving her 6th ECT session. She had an EEG on 11/16/17 which showed generalized 6 Hz theta slowing. She had improved mental status and repeat EEG was done on 11/18/17 which continued to show 5 Hz theta slowing. There is note of NMDA receptor Ab test sent, however results are unavailable for review.  She has had a complicated psychiatric history which her husband went into great detail today. He started noticing her go downhill 6 years ago when she was taking care of her elderly parents. He started helping her  and when her mother passed away 4 years ago, he noticed that her favorite comment was "I cannot do this anymore, I cannot do anything right." At that time she also became estranged with her son and he states "she hit rock bottom at that point." The more he did for her, the less she would do. He reports she has not cleaned the house or cooked for several years. He started helping her shower and get dressed 9-10 months ago. She mostly watches TV. When she started having significant anxiety, she would barely go out, since starting hydroxyzine 5 weeks ago, she now goes out with him to feed their animals but still gets very anxious and shaky when she takes a shower or puts on her shoes. He relates one time he gently reminded her to put  on her seatbelt when the car was moving and she immediately became Klein anxious she was trying to get out of the car. He notes that on a good/calm day, she does much better overall. She has started seeing psychiatrist Dr. Martina Sinner. He reports that she had been evaluated by neurologist Dr. Dimas Millin in 2014, bloodwork normal, MRI brain no acute changes, mild chronic microvascular disease. At some point she started going to [facility] where she was told she had heavy metal poisoning and was prescribed 40 pills to take daily for over a year. He reports that he did independent testing which indicated no evidence of heavy metals. [facility] Dr. Jimmye Norman in 2018 and it was felt that cognitive issues were likely due to pervasive difficulties with attention and effortful processing. She was referred to Psychiatry and started undergoing ECT for severe depression in 2019. Her husband feels that she was more alert after the first 2 sessions, but after she received a benzodiazepine with the 3rd session, she became very combative (similar to agitation after recent episode last month). She would get worse 18-20 hours after her ECT sessions, hallucinating and out of it. Ultimately after continued follow-up with Psychiatry, she has been diagnosed with dementia instead of pseudodementia.   She states her memory is good. She is slow to respond, sometimes needing repeated prompting to answer questions. She denies any headaches, dizziness, vision changes, neck/back pain, bowel/bladder dysfunction. She states she has had left hand numbness recently. She states she takes her own medications, her husband corrects her and she says yes he gives them to me. Her husband manages finances and medications, she does not drive. Sleep is good. It appears she has not been hallucinating since her last ECT treatment in 2019. She denies any olfactory/gustatory hallucinations, deja vu, rising  epigastric sensation, myoclonic jerks. She had a normal birth and early development.  There is no history of febrile convulsions, CNS infections such as meningitis/encephalitis, significant traumatic brain injury, neurosurgical procedures, or family history of seizures.   Current Outpatient Medications on File Prior to Visit  Medication Sig Dispense Refill  . acetaminophen (TYLENOL) 500 MG tablet Take 1,000 mg by mouth every 6 (six) hours as needed for mild pain.    Marland Kitchen amLODipine (NORVASC) 5 MG tablet Take 5 mg by mouth daily.    . hydrOXYzine (VISTARIL) 50 MG capsule Take 50 mg by mouth 3 (three) times daily.    . Multiple Vitamin (MULTIVITAMIN) tablet Take 1 tablet by mouth daily.     No current facility-administered medications on file prior to visit.     Observations/Objective:   Vitals:  01/31/20 1522  Weight: 150 lb (68 kg)  Height: 5\' 6"  (1.676 m)   GEN:  The patient appears stated age and is in NAD.  Neurological examination: Patient is awake, alert, repeating my questions, echolalia. Unable to follow commands, mumbling to herself. Cranial nerves: Extraocular movements appear intact . No facial asymmetry. Motor: moves all extremities symmetrically, at least anti-gravity x 4. No incoordination on finger to nose testing. Gait: narrow-based and steady   Assessment and Plan:   This is a 69 yo RH with a history of hypertension, severe depression and anxiety, cognitive impairment, whop presented after an episode concerning for seizure in April 2020. She has had EEGs in the past which showed diffuse slowing, repeat EEG in 12/2018 showed mild to moderate diffuse background slowing. MRI brain in 01/2019 showed generalized atrophy and mild chronic microvascular disease. She was unable to complete nearly all cognitive tests on Neuropsychological testing in 06/2019, uncommon even in patients with established and moderate stages of various dementing illnesses, it was felt that her ongoing  psychiatric distress is the primary culprit for her cognitive dysfunction. She had 4 treatments with TMS then had an event in the setting of significant distress, more suggestive of panic attack than seizure. She will need neurological clearance to continue TMS treatments. We discussed repeating EEG, and if unremarkable, after weighing benefits versus potential side effects (ie seizure), her husband would like to proceed with [place] treatments. Follow-up prn, they know to call for any changes.    Follow Up Instructions:   -I discussed the assessment and treatment plan with the patient. The patient was provided an opportunity to ask questions and all were answered. The patient agreed with the plan and demonstrated an understanding of the instructions.   The patient was advised to call back or seek an in-person evaluation if the symptoms worsen or if the condition fails to improve as anticipated.     Cameron Sprang, MD

## 2020-02-12 ENCOUNTER — Ambulatory Visit (INDEPENDENT_AMBULATORY_CARE_PROVIDER_SITE_OTHER): Payer: Medicare Other | Admitting: Neurology

## 2020-02-12 ENCOUNTER — Other Ambulatory Visit: Payer: Self-pay

## 2020-02-12 DIAGNOSIS — R4189 Other symptoms and signs involving cognitive functions and awareness: Secondary | ICD-10-CM

## 2020-02-12 DIAGNOSIS — F411 Generalized anxiety disorder: Secondary | ICD-10-CM

## 2020-02-12 DIAGNOSIS — F410 Panic disorder [episodic paroxysmal anxiety] without agoraphobia: Secondary | ICD-10-CM | POA: Diagnosis not present

## 2020-02-12 DIAGNOSIS — R404 Transient alteration of awareness: Secondary | ICD-10-CM

## 2020-02-23 ENCOUNTER — Encounter: Payer: Self-pay | Admitting: Neurology

## 2020-02-27 ENCOUNTER — Telehealth: Payer: Self-pay

## 2020-02-27 NOTE — Telephone Encounter (Signed)
Spoke to pt husband informed him that pts EEG was similar to her prior EEGs, no seizure discharges seen. I will write the letter to her psychiatrist that after discussing benefits of [place] and risks (meaning low risk of seizure) with her husband, they would like to proceed with [place]. PT husband informed that I will call him once the letter was ready so that he can come and pick letter up,

## 2020-02-27 NOTE — Procedures (Signed)
ELECTROENCEPHALOGRAM REPORT  Date of Study: 02/12/2020  Patient's Name: Grace Klein MRN: [id number] Date of Birth: 11-17-1950  Referring Provider: Dr. Ellouise Newer  Clinical History: This is a 69 year old woman with cognitive impairment who had an episode of stiffening, combative after, in setting of significant anxiety attack   Medications: NORVASC 5 MG tablet BUSPIRONE HCL PO VISTARIL 50 MG capsule MULTIVITAMIN tablet   Technical Summary: A multichannel digital EEG recording measured by the international 10-20 system with electrodes applied with paste and impedances below 5000 ohms performed as portable with EKG monitoring in an awake. patient.  Hyperventilation was not performed. Photic stimulation was performed.  The digital EEG was referentially recorded, reformatted, and digitally filtered in a variety of bipolar and referential montages for optimal display.   Description: The patient is awake during the recording.  There is no clear posterior dominant rhythm. There is diffuse 4-5 Hz theta slowing of the waking background. Sleep was not captured. Photic stimulation did not elicit any abnormalities.  There were no epileptiform discharges or electrographic seizures seen.    EKG lead showed sinus tachycardia at 102 bpm.  Impression: This awake EEG is abnormal due to mild to moderate diffuse slowing of the waking background.  Clinical Correlation of the above findings indicates diffuse cerebral dysfunction that is non-specific in etiology and can be seen with hypoxic/ischemic injury, toxic/metabolic encephalopathies, neurodegenerative disorders, or medication effect.  The absence of epileptiform discharges does not rule out a clinical diagnosis of epilepsy.  Clinical correlation is advised.   Ellouise Newer, M.D.

## 2020-02-27 NOTE — Telephone Encounter (Signed)
-----   Message from Cameron Sprang, MD sent at 02/27/2020  9:22 AM EDT ----- Pls call husband and let him know the EEG was similar to her prior EEGs, no seizure discharges seen. I will write the letter to her psychiatrist that after discussing benefits of [place] and risks (meaning low risk of seizure) with her husband, they would like to proceed with [place]. Thanks

## 2020-03-11 ENCOUNTER — Encounter: Payer: Self-pay | Admitting: Neurology

## 2020-11-19 IMAGING — CT CT HEAD W/O CM
4 series · 16 of 47 positions shown, 18 images · non-contrast
Comparison: Brain MRI 01/16/2019, head CT 11/23/2018

CLINICAL DATA: Seizure, nontraumatic.

EXAM:
CT HEAD WITHOUT CONTRAST
TECHNIQUE: Contiguous axial images were obtained from the base of the skull
through the vertex without intravenous contrast.

[Series 3: head bone · axial · 0.39mm/px · z∈[-174,-122]mm · 4 of 79 slices shown]
[im 8/79  bone]
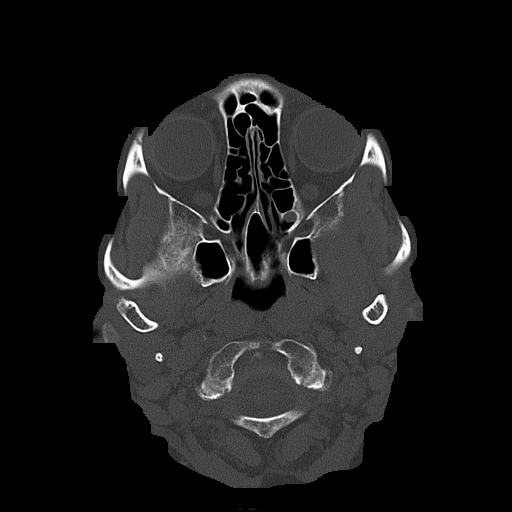
[im 15/79  bone]
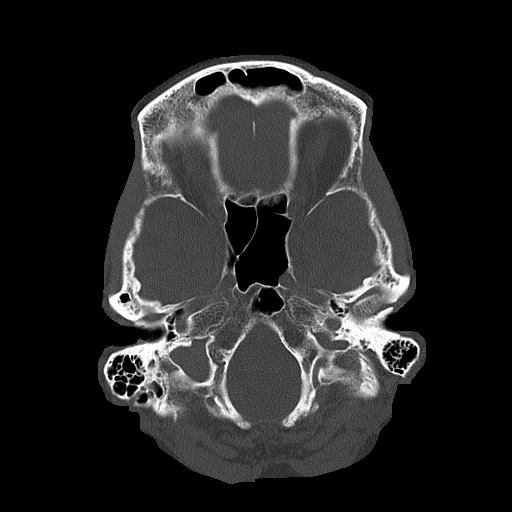
[im 27/79  bone]
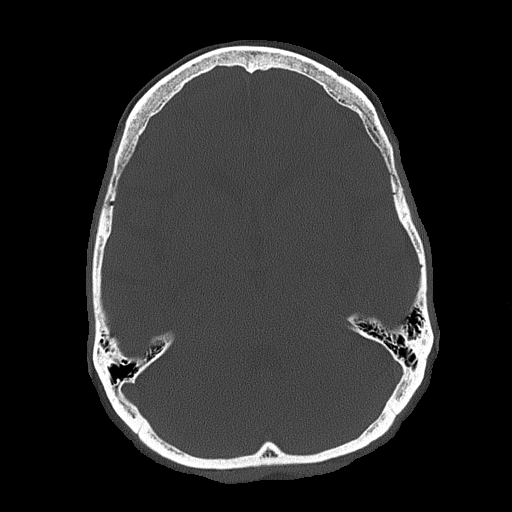
[im 34/79  bone]
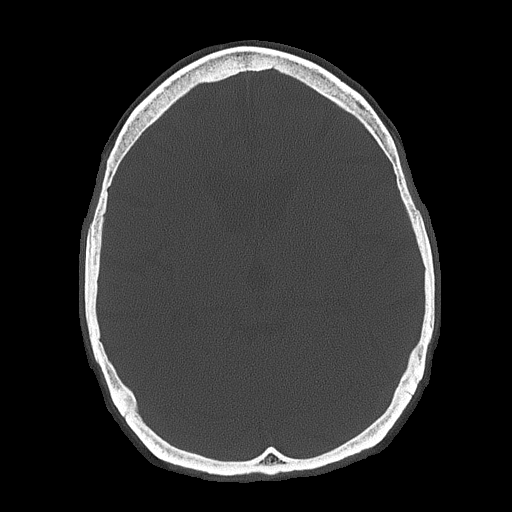

[Series 4: head without · axial · non-contrast · 0.39mm/px · z∈[-168,-63]mm · 6 of 31 slices shown, 8 images]
[im 5/31  brain]
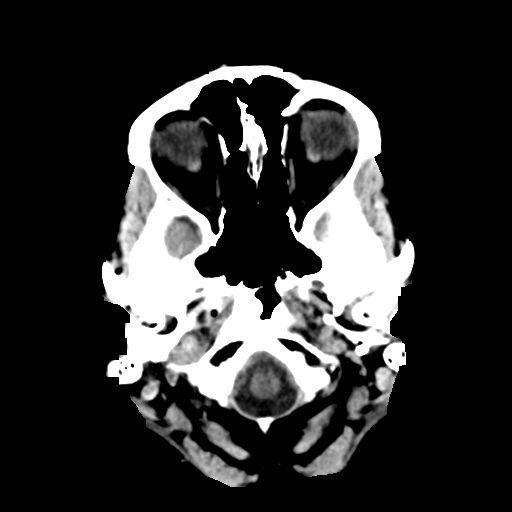
[im 5/31  bone]
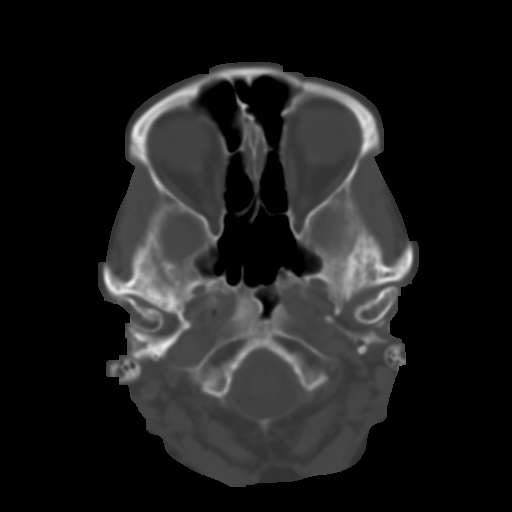
[im 9/31  brain]
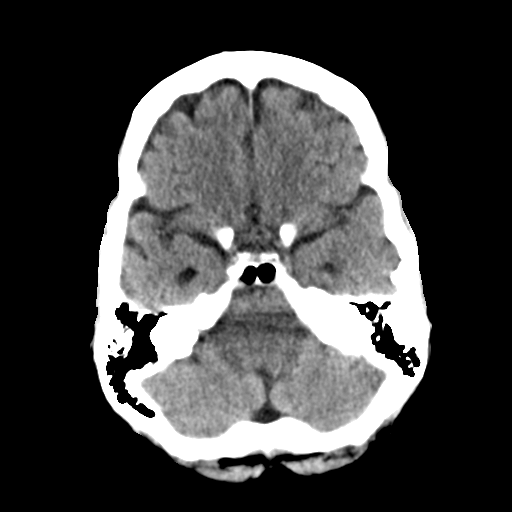
[im 13/31  brain]
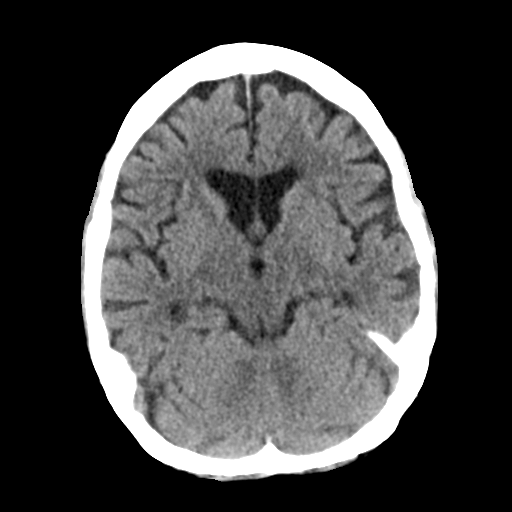
[im 18/31  brain]
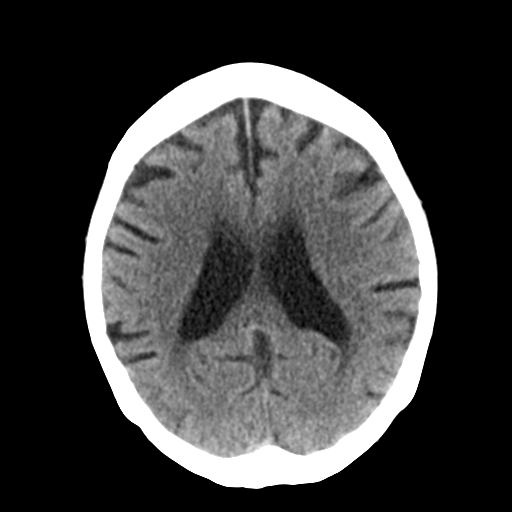
[im 22/31  brain]
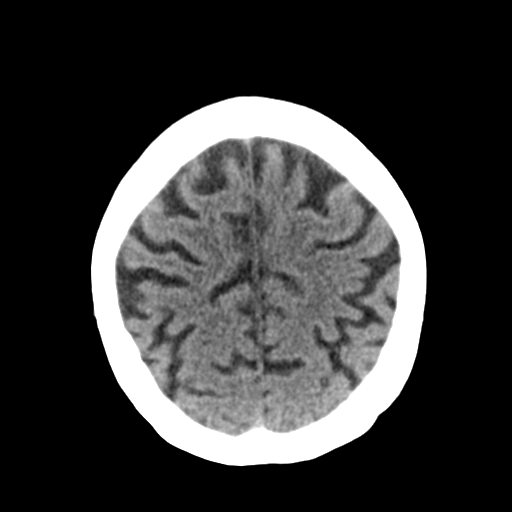
[im 22/31  bone]
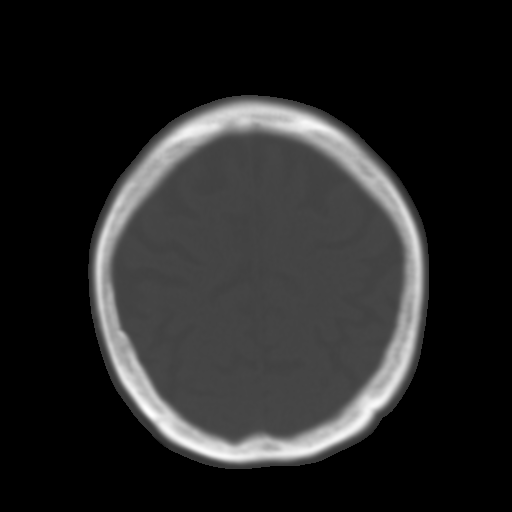
[im 26/31  brain]
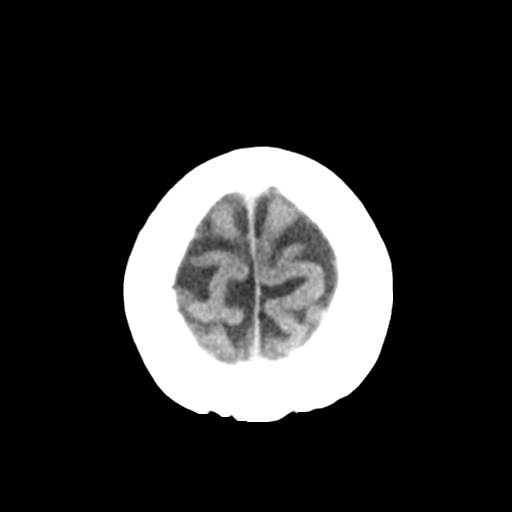

[Series 5: head without cor · coronal · non-contrast · 0.31mm/px · 3 of 66 slices shown]
[im 22/66  brain]
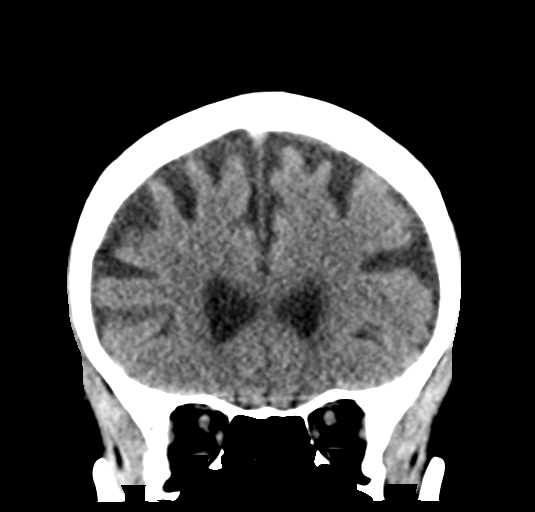
[im 29/66  brain]
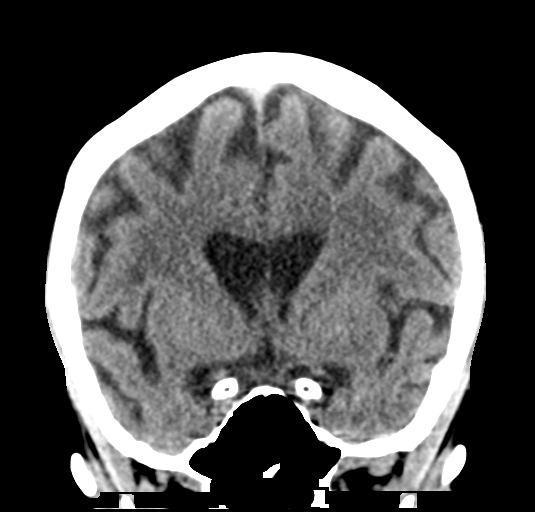
[im 37/66  brain]
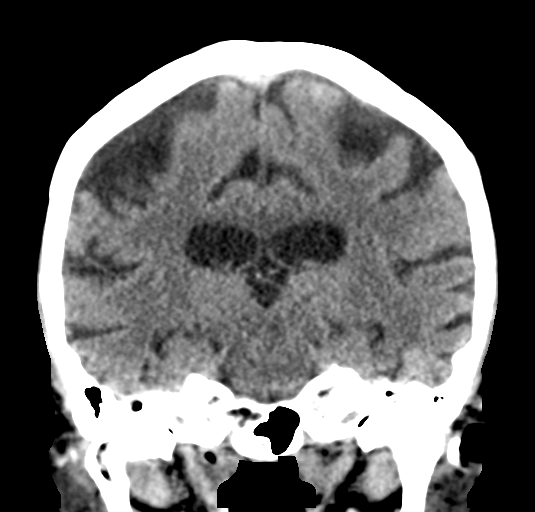

[Series 6: head without sag · sagittal · non-contrast · 0.31mm/px · 3 of 57 slices shown]
[im 19/57  brain]
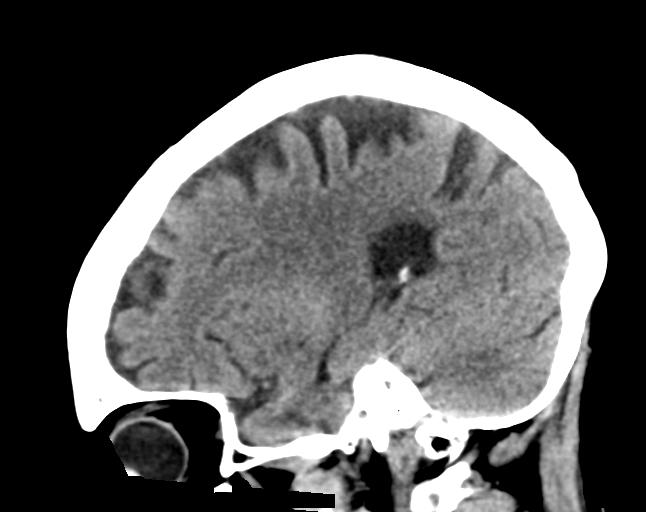
[im 29/57  brain]
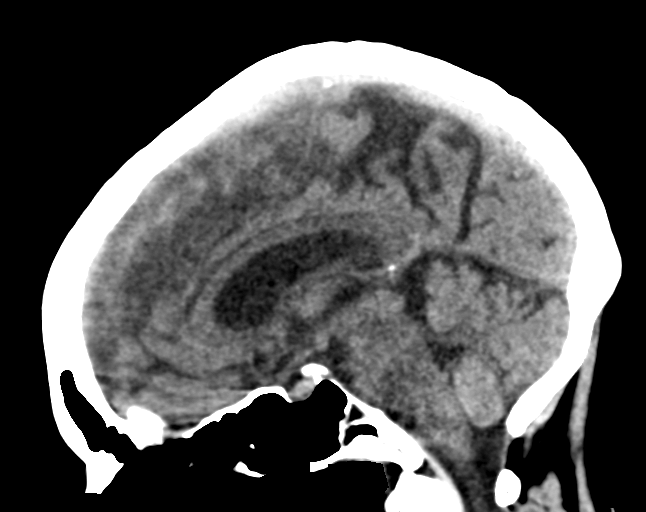
[im 38/57  brain]
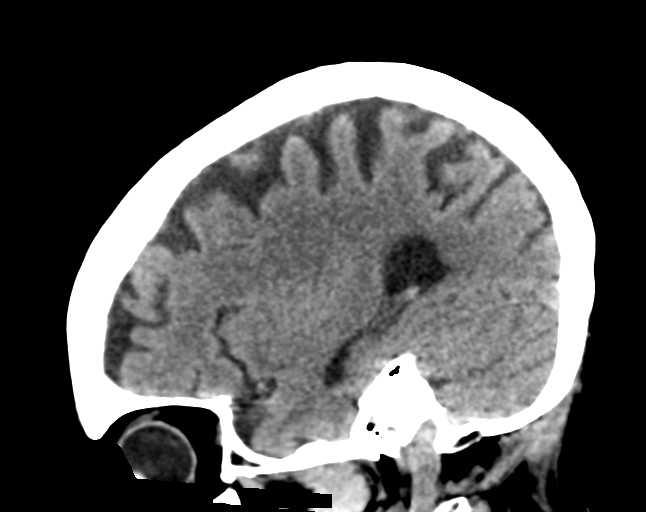

[16 of 47 positions shown; findings below may reference images not displayed]

FINDINGS: Brain:

Minimal ill-defined hypodensity within the cerebral white matter is
nonspecific, but consistent with chronic small vessel ischemic
disease. Mild generalized parenchymal atrophy. Findings are stable
as compared to prior MRI 01/16/2019.

There is no acute intracranial hemorrhage.

No demarcated cortical infarct.

No extra-axial fluid collection.

No evidence of intracranial mass.

No midline shift.

Vascular: No hyperdense vessel.

Skull: Normal. Negative for fracture or focal lesion.

Sinuses/Orbits: Visualized orbits show no acute finding. No
significant paranasal sinus disease or mastoid effusion at the
imaged levels.
IMPRESSION: 1. No evidence of acute intracranial abnormality.
2. Stable, mild generalized parenchymal atrophy and chronic small
vessel ischemic disease.

## 2021-10-01 ENCOUNTER — Encounter: Payer: Self-pay | Admitting: Gastroenterology

## 2021-10-07 ENCOUNTER — Telehealth: Payer: Self-pay

## 2021-10-07 NOTE — Telephone Encounter (Signed)
NOTES SCANNED TO REFERRAL 

## 2021-10-10 NOTE — Progress Notes (Signed)
?Cardiology Office Note:   ? ?Date:  10/13/2021  ? ?ID:  Grace Klein, DOB 04/29/1951, MRN [id number] ? ?PCP:  [facility]  ? ?[facility] ?Cardiologist:  Lenna Sciara, MD ?Referring MD: Nickola Major, MD  ? ?Chief Complaint/Reason for Referral: Syncope ? ?ASSESSMENT:   ? ?Syncope and collapse - Plan: EKG 12-Lead, ECHOCARDIOGRAM COMPLETE, LONG TERM MONITOR (3-14 DAYS) ? ?Dementia with anxiety, unspecified dementia severity, unspecified dementia type - Plan: EKG 12-Lead, ECHOCARDIOGRAM COMPLETE, LONG TERM MONITOR (3-14 DAYS) ? ?PLAN:   ? ?In order of problems listed above: ?1.  We will obtain echocardiogram and monitor and I think these are likely vasovagal in nature.  We will keep follow-up open-ended depending on the results of this testing. ?2.  Continue current therapy managed by patient's multidisciplinary team. ? ? ?Dispo:  Return if symptoms worsen or fail to improve.  ?  ? ?Medication Adjustments/Labs and Tests Ordered: ?Current medicines are reviewed at length with the patient today.  Concerns regarding medicines are outlined above.  ? ?Tests Ordered: ?Orders Placed This Encounter  ?Procedures  ? LONG TERM MONITOR (3-14 DAYS)  ? EKG 12-Lead  ? ECHOCARDIOGRAM COMPLETE  ? ? ?Medication Changes: ?No orders of the defined types were placed in this encounter. ? ? ?History of Present Illness:   ? ?FOCUSED PROBLEM LIST:   ?1.  Dementia with cognitive impairment ?2.  Anxiety and severe depression ?3.  Hypertension ? ?The patient is a 71 y.o. female with the indicated medical history here for recommendations regarding syncope.  She has had multiple episodes of syncope in the past with an extensive evaluation including neurology with multiple EEGs.  These episodes seem to be during times when the patient is yelling and perhaps emotional or fearful.  Several of her prior provider notes suggest these to be vasovagal in nature.  She was recently taken off a lot of her  medications and this has helped. ? ?Per the patient's husband she has had 6 syncopal episodes in the last few years.  They all seem to be associated with fear response.  She will be yelling or screaming due to something that scares her.  1 time there refers on top of her house that scared her and she had a syncopal episode from this.  The patient's husband notes that her blood pressure is low when she has these syncopal episodes.  Her work-up thus far has been completely negative. ? ?    ?  ?Previous Medical History: ?Past Medical History:  ?Diagnosis Date  ? Allergic state 05/05/2011  ? Allergy   ? seasonal  ? Chicken pox as a child  ? Cough 05/05/2011  ? Dermatitis 05/05/2011  ? Diarrhea 05/15/2012  ? Ear pain 05/05/2011  ? Elevated BP 05/05/2011  ? Fluctuating blood pressure   ? Generalized anxiety disorder with panic attacks 05/15/2012  ? Hyperlipidemia 06/05/2011  ? Measles as a child  ? Melanoma 06/2010  ? Bridge of nose  ? Migraine 05/13/2011  ? Mumps as a child  ? Osteopenia 05/05/2011  ? Ovarian cyst 12/08/2011  ? Left Pain on right intermittently Dr Wynetta Emery of GYN is following this and next [place] is scheduled for July 2013.  ? Reflux 12/08/2011  ? Syncope   ? ? ? ?Current Medications: ?Current Meds  ?Medication Sig  ? Multiple Vitamin (MULTIVITAMIN) tablet Take 1 tablet by mouth daily.  ? [DISCONTINUED] acetaminophen (TYLENOL) 500 MG tablet Take 1,000 mg by mouth every 6 (  six) hours as needed for mild pain.  ? [DISCONTINUED] amLODipine (NORVASC) 5 MG tablet Take 5 mg by mouth daily.  ? [DISCONTINUED] hydrOXYzine (VISTARIL) 50 MG capsule Take 50 mg by mouth 3 (three) times daily.  ?  ? ?Allergies:    ?Benzodiazepines and Clindamycin/lincomycin  ? ?Social History:   ?Social History  ? ?Tobacco Use  ? Smoking status: Never  ? Smokeless tobacco: Never  ?Substance Use Topics  ? Alcohol use: No  ?  Comment: a couple ounces once or twice a week  ? Drug use: No  ?  ? ?Family Hx: ?Family History  ?Problem Relation Age  of Onset  ? Macular degeneration Mother   ? Hypertension Mother   ? Arthritis Mother   ? Hyperlipidemia Mother   ? Hypertension Father   ? Heart disease Father   ? Other Father   ?     bypass surgery   ? Hyperlipidemia Father   ? Depression Sister   ?     on Paxil  ? Alcohol abuse Sister   ? Diabetes Maternal Grandmother   ? Colon cancer Neg Hx   ? Esophageal cancer Neg Hx   ? Stomach cancer Neg Hx   ?  ? ?Review of Systems:   ?Please see the history of present illness.    ?All other systems reviewed and are negative. ?  ? ? ?EKGs/Labs/Other Test Reviewed:   ? ?EKG: EKG from 2021 demonstrates sinus tachycardia; sinus rhythm with PVC ? ?Prior CV studies: ?None available ? ?Imaging studies that I have independently reviewed today:  ? ?CT Head 4/21 ?1. No evidence of acute intracranial abnormality. ?2. Stable, mild generalized parenchymal atrophy and chronic small ?vessel ischemic disease. ? ?Recent Labs: ?No results found for requested labs within last 8760 hours.  ? ?Recent Lipid Panel ?Lab Results  ?Component Value Date/Time  ? CHOL 223 (H) 05/06/2012 08:16 AM  ? TRIG 104.0 05/06/2012 08:16 AM  ? HDL 66.70 05/06/2012 08:16 AM  ? LDLDIRECT 136.3 05/06/2012 08:16 AM  ? ? ?Risk Assessment/Calculations:   ? ? ?    ? ?Physical Exam:   ? ?VS:  BP 122/64   Pulse 77   Ht '5\' 5"'$  (1.651 m)   Wt 112 lb (50.8 kg)   BMI 18.64 kg/m?    ?Wt Readings from Last 3 Encounters:  ?10/13/21 112 lb (50.8 kg)  ?01/31/20 150 lb (68 kg)  ?11/30/19 160 lb 0.9 oz (72.6 kg)  ?  ?GENERAL:  No apparent distress, Aox3, frail ?HEENT:  No carotid bruits, +2 carotid impulses, no scleral icterus ?CAR: RRR no murmurs, gallops, rubs, or thrills ?RES:  Clear to auscultation bilaterally ?ABD:  Soft, nontender, nondistended, positive bowel sounds x 4 ?VASC:  +2 radial pulses, +2 carotid pulses, palpable pedal pulses ?NEURO:  CN 2-12 grossly intact; motor and sensory grossly intact ?PSYCH:  No active depression or anxiety ?EXT:  No edema, ecchymosis, or  cyanosis ? ?Signed, ?Early Osmond, MD  ?10/13/2021 1:55 PM    ?[facility] ?[address], [place], [place]  [address] ?Phone: [phone]; Fax: [phone]  ? ?Note:  This document was prepared using Dragon voice recognition software and may include unintentional dictation errors. ?

## 2021-10-13 ENCOUNTER — Encounter: Payer: Self-pay | Admitting: Internal Medicine

## 2021-10-13 ENCOUNTER — Ambulatory Visit (INDEPENDENT_AMBULATORY_CARE_PROVIDER_SITE_OTHER): Payer: Medicare Other

## 2021-10-13 ENCOUNTER — Ambulatory Visit (INDEPENDENT_AMBULATORY_CARE_PROVIDER_SITE_OTHER): Payer: Medicare Other | Admitting: Internal Medicine

## 2021-10-13 ENCOUNTER — Other Ambulatory Visit: Payer: Self-pay

## 2021-10-13 VITALS — BP 122/64 | HR 77 | Ht 65.0 in | Wt 112.0 lb

## 2021-10-13 DIAGNOSIS — R55 Syncope and collapse: Secondary | ICD-10-CM

## 2021-10-13 DIAGNOSIS — F0394 Unspecified dementia, unspecified severity, with anxiety: Secondary | ICD-10-CM | POA: Diagnosis not present

## 2021-10-13 NOTE — Progress Notes (Unsigned)
Enrolled for [employer] to mail a ZIO XT long term holter monitor to the patients address on file.  

## 2021-10-13 NOTE — Patient Instructions (Signed)
Medication Instructions:  ?Your physician recommends that you continue on your current medications as directed. Please refer to the Current Medication list given to you today. ? ?*If you need a refill on your cardiac medications before your next appointment, please call your pharmacy* ? ? ?Lab Work: ?NONE ?If you have labs (blood work) drawn today and your tests are completely normal, you will receive your results only by: ?MyChart Message (if you have MyChart) OR ?A paper copy in the mail ?If you have any lab test that is abnormal or we need to change your treatment, we will call you to review the results. ? ? ?Testing/Procedures: ?ZIO- 3 days ?Your physician has recommended that you wear an event monitor. Event monitors are medical devices that record the heart?s electrical activity. Doctors most often [place] these monitors to diagnose arrhythmias. Arrhythmias are problems with the speed or rhythm of the heartbeat. The monitor is a small, portable device. You can wear one while you do your normal daily activities. This is usually used to diagnose what is causing palpitations/syncope (passing out). ? ?ECHO ?Your physician has requested that you have an echocardiogram. Echocardiography is a painless test that uses sound waves to create images of your heart. It provides your doctor with information about the size and shape of your heart and how well your heart?s chambers and valves are working. This procedure takes approximately one hour. There are no restrictions for this procedure. ? ? ? ?Follow-Up: ?At [facility], you and your health needs are our priority.  As part of our continuing mission to provide you with exceptional heart care, we have created designated Provider Care Teams.  These Care Teams include your primary Cardiologist (physician) and Advanced Practice Providers (APPs -  Physician Assistants and Nurse Practitioners) who all work together to provide you with the care you need, when you need it. ? ?We  recommend signing up for the patient portal called "MyChart".  Sign up information is provided on this After Visit Summary.  MyChart is used to connect with patients for Virtual Visits (Telemedicine).  Patients are able to view lab/test results, encounter notes, upcoming appointments, etc.  Non-urgent messages can be sent to your provider as well.   ?To learn more about what you can do with MyChart, go to [email or web address].   ? ?Your next appointment:   ?As Needed ?Provider:   ?Clarene Duke Thukkani1}  ? ?Other Instructions ?ZIO XT- Long Term Monitor Instructions ? ?Your physician has requested you wear a ZIO patch monitor for 14 days.  ?This is a single patch monitor. [employer] supplies one patch monitor per enrollment. Additional ?stickers are not available. Please do not apply patch if you will be having a Nuclear Stress Test,  ?Echocardiogram, Cardiac CT, MRI, or Chest Xray during the period you would be wearing the  ?monitor. The patch cannot be worn during these tests. You cannot remove and re-apply the  ?ZIO XT patch monitor.  ?Your ZIO patch monitor will be mailed 3 day USPS to your address on file. It may take 3-5 days  ?to receive your monitor after you have been enrolled.  ?Once you have received your monitor, please review the enclosed instructions. Your monitor  ?has already been registered assigning a specific monitor serial # to you. ? ?Billing and Patient Assistance Program Information ? ?We have supplied [employer] with any of your insurance information on file for billing purposes. ?[employer] offers a sliding scale Patient Assistance Program for patients that do not have  ?insurance,  or whose insurance does not completely cover the cost of the ZIO monitor.  ?You must apply for the Patient Assistance Program to qualify for this discounted rate.  ?To apply, please call [employer] at [phone], select option 4, select option 2, ask to apply for  ?Patient Assistance Program. Theodore Demark will ask your household  income, and how many people  ?are in your household. They will quote your out-of-pocket cost based on that information.  ?[employer] will also be able to set up a 82-month interest-free payment plan if needed. ? ?Applying the monitor ?  ?Shave hair from upper left chest.  ?Hold abrader disc by orange tab. Rub abrader in 40 strokes over the upper left chest as  ?indicated in your monitor instructions.  ?Clean area with 4 enclosed alcohol pads. Let dry.  ?Apply patch as indicated in monitor instructions. Patch will be placed under collarbone on left  ?side of chest with arrow pointing upward.  ?Rub patch adhesive wings for 2 minutes. Remove white label marked "1". Remove the white  ?label marked "2". Rub patch adhesive wings for 2 additional minutes.  ?While looking in a mirror, press and release button in center of patch. A small green light will  ?flash 3-4 times. This will be your only indicator that the monitor has been turned on.  ?Do not shower for the first 24 hours. You may shower after the first 24 hours.  ?Press the button if you feel a symptom. You will hear a small click. Record Date, Time and  ?Symptom in the Patient Logbook.  ?When you are ready to remove the patch, follow instructions on the last 2 pages of Patient  ?Logbook. Stick patch monitor onto the last page of Patient Logbook.  ?Place Patient Logbook in the blue and white box. Use locking tab on box and tape box closed  ?securely. The blue and white box has prepaid postage on it. Please place it in the mailbox as  ?soon as possible. Your physician should have your test results approximately 7 days after the  ?monitor has been [facility]  ?Call [facility]at [phone] you have questions regarding  ?your ZIO XT patch monitor. Call them immediately if you see an orange light blinking on your  ?monitor.  ?If your monitor falls off in less than 4 days, contact our Monitor department at [phone]  ?If your  monitor becomes loose or falls off after 4 days call [employer] at [phone]  ?suggestions on securing your monitor ?  ?  ?

## 2021-10-17 DIAGNOSIS — R55 Syncope and collapse: Secondary | ICD-10-CM

## 2021-10-28 ENCOUNTER — Ambulatory Visit (HOSPITAL_COMMUNITY): Payer: Medicare Other | Attending: Internal Medicine

## 2021-10-28 ENCOUNTER — Other Ambulatory Visit: Payer: Self-pay

## 2021-10-28 ENCOUNTER — Other Ambulatory Visit: Payer: Self-pay | Admitting: Internal Medicine

## 2021-10-28 DIAGNOSIS — F0394 Unspecified dementia, unspecified severity, with anxiety: Secondary | ICD-10-CM

## 2021-10-28 DIAGNOSIS — R55 Syncope and collapse: Secondary | ICD-10-CM

## 2021-10-28 LAB — ECHOCARDIOGRAM LIMITED: S' Lateral: 2 cm
